# Patient Record
Sex: Female | Born: 1945 | Race: White | Hispanic: No | Smoking: Never smoker
Health system: Southern US, Community
[De-identification: ages and names within clinical notes are randomized; demographics above are authoritative.]

## PROBLEM LIST (undated history)

## (undated) DIAGNOSIS — I499 Cardiac arrhythmia, unspecified: Secondary | ICD-10-CM

## (undated) DIAGNOSIS — C801 Malignant (primary) neoplasm, unspecified: Secondary | ICD-10-CM

## (undated) DIAGNOSIS — F419 Anxiety disorder, unspecified: Secondary | ICD-10-CM

## (undated) DIAGNOSIS — M1990 Unspecified osteoarthritis, unspecified site: Secondary | ICD-10-CM

## (undated) DIAGNOSIS — F4310 Post-traumatic stress disorder, unspecified: Secondary | ICD-10-CM

## (undated) DIAGNOSIS — E785 Hyperlipidemia, unspecified: Secondary | ICD-10-CM

## (undated) DIAGNOSIS — I10 Essential (primary) hypertension: Secondary | ICD-10-CM

## (undated) HISTORY — DX: Essential (primary) hypertension: I10

## (undated) HISTORY — DX: Cardiac arrhythmia, unspecified: I49.9

## (undated) HISTORY — DX: Hyperlipidemia, unspecified: E78.5

## (undated) HISTORY — DX: Post-traumatic stress disorder, unspecified: F43.10

## (undated) HISTORY — PX: APPENDECTOMY: SHX54

## (undated) HISTORY — DX: Unspecified osteoarthritis, unspecified site: M19.90

## (undated) HISTORY — DX: Anxiety disorder, unspecified: F41.9

---

## 1975-04-18 DIAGNOSIS — S3992XA Unspecified injury of lower back, initial encounter: Secondary | ICD-10-CM

## 1975-04-18 HISTORY — DX: Unspecified injury of lower back, initial encounter: S39.92XA

## 2006-09-11 ENCOUNTER — Ambulatory Visit: Payer: Self-pay | Admitting: Gastroenterology

## 2007-12-12 ENCOUNTER — Ambulatory Visit: Payer: Self-pay

## 2009-01-26 ENCOUNTER — Emergency Department: Payer: Self-pay | Admitting: Emergency Medicine

## 2009-02-02 ENCOUNTER — Emergency Department: Payer: Self-pay | Admitting: Emergency Medicine

## 2009-02-09 ENCOUNTER — Emergency Department: Payer: Self-pay | Admitting: Emergency Medicine

## 2009-03-01 ENCOUNTER — Emergency Department: Payer: Self-pay | Admitting: Emergency Medicine

## 2009-03-03 ENCOUNTER — Ambulatory Visit: Payer: Self-pay | Admitting: Physician Assistant

## 2009-03-03 ENCOUNTER — Emergency Department: Payer: Self-pay | Admitting: Emergency Medicine

## 2009-03-08 ENCOUNTER — Emergency Department: Payer: Self-pay | Admitting: Emergency Medicine

## 2009-03-11 ENCOUNTER — Emergency Department: Payer: Self-pay | Admitting: Emergency Medicine

## 2009-03-14 ENCOUNTER — Ambulatory Visit: Payer: Self-pay | Admitting: Internal Medicine

## 2009-03-24 ENCOUNTER — Emergency Department: Payer: Self-pay | Admitting: Unknown Physician Specialty

## 2009-03-30 ENCOUNTER — Emergency Department: Payer: Self-pay | Admitting: Emergency Medicine

## 2009-04-09 ENCOUNTER — Emergency Department: Payer: Self-pay | Admitting: Internal Medicine

## 2009-04-19 ENCOUNTER — Emergency Department: Payer: Self-pay | Admitting: Emergency Medicine

## 2010-01-24 ENCOUNTER — Encounter: Payer: Self-pay | Admitting: Specialist

## 2014-05-19 ENCOUNTER — Ambulatory Visit: Payer: Self-pay | Admitting: Family Medicine

## 2014-06-16 ENCOUNTER — Ambulatory Visit
Admit: 2014-06-16 | Disposition: A | Discharge status: Home / Self Care | Payer: Self-pay | Attending: Family Medicine | Admitting: Family Medicine

## 2014-07-17 ENCOUNTER — Ambulatory Visit
Admit: 2014-07-17 | Disposition: A | Discharge status: Home / Self Care | Payer: Self-pay | Attending: Family Medicine | Admitting: Family Medicine

## 2014-07-21 ENCOUNTER — Encounter
Admit: 2014-07-21 | Disposition: A | Discharge status: Home / Self Care | Payer: Self-pay | Attending: Family Medicine | Admitting: Family Medicine

## 2014-08-20 ENCOUNTER — Ambulatory Visit: Payer: Self-pay | Admitting: Physical Therapy

## 2014-08-21 ENCOUNTER — Ambulatory Visit: Payer: Medicare PPO | Attending: Family Medicine | Admitting: Physical Therapy

## 2014-08-27 ENCOUNTER — Encounter: Payer: Self-pay | Admitting: Physical Therapy

## 2014-09-03 ENCOUNTER — Encounter: Payer: Self-pay | Admitting: Physical Therapy

## 2014-09-10 ENCOUNTER — Encounter: Payer: Self-pay | Admitting: Physical Therapy

## 2015-04-29 DIAGNOSIS — G894 Chronic pain syndrome: Secondary | ICD-10-CM | POA: Diagnosis not present

## 2015-04-29 DIAGNOSIS — Z0001 Encounter for general adult medical examination with abnormal findings: Secondary | ICD-10-CM | POA: Diagnosis not present

## 2015-04-29 DIAGNOSIS — I10 Essential (primary) hypertension: Secondary | ICD-10-CM | POA: Diagnosis not present

## 2015-04-29 DIAGNOSIS — F411 Generalized anxiety disorder: Secondary | ICD-10-CM | POA: Diagnosis not present

## 2015-04-29 DIAGNOSIS — K219 Gastro-esophageal reflux disease without esophagitis: Secondary | ICD-10-CM | POA: Diagnosis not present

## 2015-04-29 DIAGNOSIS — K0889 Other specified disorders of teeth and supporting structures: Secondary | ICD-10-CM | POA: Diagnosis not present

## 2015-05-06 ENCOUNTER — Encounter: Payer: Self-pay | Admitting: *Deleted

## 2015-07-26 DIAGNOSIS — F411 Generalized anxiety disorder: Secondary | ICD-10-CM | POA: Diagnosis not present

## 2015-07-26 DIAGNOSIS — J301 Allergic rhinitis due to pollen: Secondary | ICD-10-CM | POA: Diagnosis not present

## 2015-07-26 DIAGNOSIS — E119 Type 2 diabetes mellitus without complications: Secondary | ICD-10-CM | POA: Diagnosis not present

## 2015-07-26 DIAGNOSIS — G894 Chronic pain syndrome: Secondary | ICD-10-CM | POA: Diagnosis not present

## 2015-07-26 DIAGNOSIS — K219 Gastro-esophageal reflux disease without esophagitis: Secondary | ICD-10-CM | POA: Diagnosis not present

## 2015-07-26 DIAGNOSIS — I10 Essential (primary) hypertension: Secondary | ICD-10-CM | POA: Diagnosis not present

## 2015-11-04 DIAGNOSIS — R51 Headache: Secondary | ICD-10-CM | POA: Diagnosis not present

## 2015-11-04 DIAGNOSIS — I10 Essential (primary) hypertension: Secondary | ICD-10-CM | POA: Diagnosis not present

## 2015-11-04 DIAGNOSIS — E782 Mixed hyperlipidemia: Secondary | ICD-10-CM | POA: Diagnosis not present

## 2015-11-04 DIAGNOSIS — Z79899 Other long term (current) drug therapy: Secondary | ICD-10-CM | POA: Diagnosis not present

## 2015-11-04 DIAGNOSIS — Z6826 Body mass index (BMI) 26.0-26.9, adult: Secondary | ICD-10-CM | POA: Diagnosis not present

## 2015-11-04 DIAGNOSIS — T887XXA Unspecified adverse effect of drug or medicament, initial encounter: Secondary | ICD-10-CM | POA: Diagnosis not present

## 2015-11-04 DIAGNOSIS — Z1389 Encounter for screening for other disorder: Secondary | ICD-10-CM | POA: Diagnosis not present

## 2015-11-04 DIAGNOSIS — M546 Pain in thoracic spine: Secondary | ICD-10-CM | POA: Diagnosis not present

## 2015-11-04 DIAGNOSIS — F411 Generalized anxiety disorder: Secondary | ICD-10-CM | POA: Diagnosis not present

## 2016-01-28 DIAGNOSIS — Z6826 Body mass index (BMI) 26.0-26.9, adult: Secondary | ICD-10-CM | POA: Diagnosis not present

## 2016-01-28 DIAGNOSIS — E119 Type 2 diabetes mellitus without complications: Secondary | ICD-10-CM | POA: Diagnosis not present

## 2016-01-28 DIAGNOSIS — K219 Gastro-esophageal reflux disease without esophagitis: Secondary | ICD-10-CM | POA: Diagnosis not present

## 2016-01-28 DIAGNOSIS — G894 Chronic pain syndrome: Secondary | ICD-10-CM | POA: Diagnosis not present

## 2016-01-28 DIAGNOSIS — I10 Essential (primary) hypertension: Secondary | ICD-10-CM | POA: Diagnosis not present

## 2016-01-28 DIAGNOSIS — E782 Mixed hyperlipidemia: Secondary | ICD-10-CM | POA: Diagnosis not present

## 2016-01-28 DIAGNOSIS — F411 Generalized anxiety disorder: Secondary | ICD-10-CM | POA: Diagnosis not present

## 2016-02-18 DIAGNOSIS — Z1211 Encounter for screening for malignant neoplasm of colon: Secondary | ICD-10-CM | POA: Diagnosis not present

## 2016-02-18 DIAGNOSIS — Z1212 Encounter for screening for malignant neoplasm of rectum: Secondary | ICD-10-CM | POA: Diagnosis not present

## 2016-03-16 DIAGNOSIS — E782 Mixed hyperlipidemia: Secondary | ICD-10-CM | POA: Diagnosis not present

## 2016-03-16 DIAGNOSIS — I10 Essential (primary) hypertension: Secondary | ICD-10-CM | POA: Diagnosis not present

## 2016-03-16 DIAGNOSIS — Z6827 Body mass index (BMI) 27.0-27.9, adult: Secondary | ICD-10-CM | POA: Diagnosis not present

## 2016-03-16 DIAGNOSIS — Z79899 Other long term (current) drug therapy: Secondary | ICD-10-CM | POA: Diagnosis not present

## 2016-03-16 DIAGNOSIS — J206 Acute bronchitis due to rhinovirus: Secondary | ICD-10-CM | POA: Diagnosis not present

## 2016-05-10 DIAGNOSIS — G894 Chronic pain syndrome: Secondary | ICD-10-CM | POA: Diagnosis not present

## 2016-05-10 DIAGNOSIS — F411 Generalized anxiety disorder: Secondary | ICD-10-CM | POA: Diagnosis not present

## 2016-05-10 DIAGNOSIS — Z6827 Body mass index (BMI) 27.0-27.9, adult: Secondary | ICD-10-CM | POA: Diagnosis not present

## 2016-05-10 DIAGNOSIS — R51 Headache: Secondary | ICD-10-CM | POA: Diagnosis not present

## 2016-07-07 DIAGNOSIS — Z6828 Body mass index (BMI) 28.0-28.9, adult: Secondary | ICD-10-CM | POA: Diagnosis not present

## 2016-07-07 DIAGNOSIS — F411 Generalized anxiety disorder: Secondary | ICD-10-CM | POA: Diagnosis not present

## 2016-07-07 DIAGNOSIS — G894 Chronic pain syndrome: Secondary | ICD-10-CM | POA: Diagnosis not present

## 2016-07-07 DIAGNOSIS — R05 Cough: Secondary | ICD-10-CM | POA: Diagnosis not present

## 2016-07-07 DIAGNOSIS — R51 Headache: Secondary | ICD-10-CM | POA: Diagnosis not present

## 2016-08-21 DIAGNOSIS — I10 Essential (primary) hypertension: Secondary | ICD-10-CM | POA: Diagnosis not present

## 2016-08-21 DIAGNOSIS — E782 Mixed hyperlipidemia: Secondary | ICD-10-CM | POA: Diagnosis not present

## 2016-08-21 DIAGNOSIS — G894 Chronic pain syndrome: Secondary | ICD-10-CM | POA: Diagnosis not present

## 2016-08-21 DIAGNOSIS — Z6827 Body mass index (BMI) 27.0-27.9, adult: Secondary | ICD-10-CM | POA: Diagnosis not present

## 2016-08-21 DIAGNOSIS — Z0001 Encounter for general adult medical examination with abnormal findings: Secondary | ICD-10-CM | POA: Diagnosis not present

## 2016-09-26 DIAGNOSIS — I10 Essential (primary) hypertension: Secondary | ICD-10-CM | POA: Insufficient documentation

## 2016-09-26 DIAGNOSIS — Z780 Asymptomatic menopausal state: Secondary | ICD-10-CM | POA: Insufficient documentation

## 2016-09-28 ENCOUNTER — Ambulatory Visit: Payer: PPO | Admitting: Pain Medicine

## 2016-10-04 ENCOUNTER — Ambulatory Visit (INDEPENDENT_AMBULATORY_CARE_PROVIDER_SITE_OTHER): Payer: Medicare Other | Admitting: Family Medicine

## 2016-10-04 ENCOUNTER — Encounter: Payer: Self-pay | Admitting: Family Medicine

## 2016-10-04 VITALS — BP 138/84 | HR 96 | Resp 16 | Ht 64.0 in | Wt 153.0 lb

## 2016-10-04 DIAGNOSIS — E663 Overweight: Secondary | ICD-10-CM

## 2016-10-04 DIAGNOSIS — E785 Hyperlipidemia, unspecified: Secondary | ICD-10-CM | POA: Diagnosis not present

## 2016-10-04 DIAGNOSIS — F411 Generalized anxiety disorder: Secondary | ICD-10-CM | POA: Diagnosis not present

## 2016-10-04 DIAGNOSIS — Z23 Encounter for immunization: Secondary | ICD-10-CM

## 2016-10-04 DIAGNOSIS — F410 Panic disorder [episodic paroxysmal anxiety] without agoraphobia: Secondary | ICD-10-CM | POA: Diagnosis not present

## 2016-10-04 DIAGNOSIS — I10 Essential (primary) hypertension: Secondary | ICD-10-CM

## 2016-10-04 DIAGNOSIS — G8929 Other chronic pain: Secondary | ICD-10-CM | POA: Diagnosis not present

## 2016-10-04 DIAGNOSIS — E119 Type 2 diabetes mellitus without complications: Secondary | ICD-10-CM | POA: Diagnosis not present

## 2016-10-04 DIAGNOSIS — M545 Low back pain: Secondary | ICD-10-CM | POA: Diagnosis not present

## 2016-10-04 DIAGNOSIS — M542 Cervicalgia: Secondary | ICD-10-CM

## 2016-10-04 MED ORDER — ENALAPRIL MALEATE 5 MG PO TABS: 5.0000 mg | ORAL_TABLET | Freq: Every day | ORAL | 3 refills | Status: AC

## 2016-10-04 MED ORDER — VERAPAMIL HCL ER 240 MG PO TBCR: 240.0000 mg | EXTENDED_RELEASE_TABLET | Freq: Every day | ORAL | 3 refills | Status: AC

## 2016-10-04 MED ORDER — ZOSTER VAC RECOMB ADJUVANTED 50 MCG/0.5ML IM SUSR: 0.5000 mL | Freq: Once | INTRAMUSCULAR | 1 refills | Status: AC

## 2016-10-06 DIAGNOSIS — F410 Panic disorder [episodic paroxysmal anxiety] without agoraphobia: Secondary | ICD-10-CM | POA: Insufficient documentation

## 2016-10-06 DIAGNOSIS — F4310 Post-traumatic stress disorder, unspecified: Secondary | ICD-10-CM | POA: Insufficient documentation

## 2016-10-06 DIAGNOSIS — G8929 Other chronic pain: Secondary | ICD-10-CM | POA: Insufficient documentation

## 2016-10-06 DIAGNOSIS — E785 Hyperlipidemia, unspecified: Secondary | ICD-10-CM | POA: Insufficient documentation

## 2016-10-06 DIAGNOSIS — M549 Dorsalgia, unspecified: Secondary | ICD-10-CM

## 2016-10-06 DIAGNOSIS — E119 Type 2 diabetes mellitus without complications: Secondary | ICD-10-CM | POA: Insufficient documentation

## 2016-10-06 DIAGNOSIS — F411 Generalized anxiety disorder: Secondary | ICD-10-CM

## 2016-10-06 DIAGNOSIS — M542 Cervicalgia: Secondary | ICD-10-CM

## 2016-10-06 DIAGNOSIS — E663 Overweight: Secondary | ICD-10-CM | POA: Insufficient documentation

## 2016-10-06 NOTE — Progress Notes (Signed)
Date:  10/04/2016   Name:  Cassandra Woodward   DOB:  [birth year]   MRN:  [id number]  PCP:  Adline Potter, MD    Chief Complaint: Establish Care   History of Present Illness:  This is a 71 y.o. female seen for initial visit. Hx PTSD/anxiety/panic attacks on daily Xanax, followed by neuropysch, tapering Fioricet for chronic HA/neck/back pain. Takes tramadol 1-6 daily, followed by pain clinic. Tried starting Paxil, made HTN/anxiety worse. Hx T2DM, lost 40# on Atkins diet, never started meds, a1c 5.8% last week, MCR ok. HTN well controlled on verapamil/enalapril x yrs. LDL 169, TG 304 last week, never on lipid med. Takes Lomotil 1-2 times yearly, Compazine 1-2 times yearly, albuterol q5m, antacid prn, on biotin in past. Father died 35 pneumonia, mother died 48 TBI, brother died 83 unknown cause. Colonoscopy 6 yrs ago ok, Cologuard last year ok, tet < 10 yrs, pneumo x 2, needs zoster, due for mammo/Pap.per pt, no positive Paps in past.  Review of Systems:  Review of Systems  Constitutional: Negative for appetite change, chills, fever and unexpected weight change.  HENT: Negative for ear pain, sinus pain, sore throat and trouble swallowing.   Eyes: Negative for pain.  Respiratory: Negative for cough and shortness of breath.   Cardiovascular: Negative for chest pain and leg swelling.  Gastrointestinal: Negative for abdominal distention, blood in stool, constipation and diarrhea.  Endocrine: Negative for polydipsia and polyuria.  Genitourinary: Negative for difficulty urinating, dysuria and vaginal bleeding.  Musculoskeletal: Negative for myalgias.  Neurological: Negative for tremors, syncope, weakness, light-headedness and numbness.  Hematological: Negative for adenopathy.  Psychiatric/Behavioral: Negative for confusion and dysphoric mood.    Patient Active Problem List   Diagnosis Date Noted  . Diabetes mellitus type 2, controlled, without complications ([place]) 42/70/6237  . Hyperlipidemia  10/06/2016  . Generalized anxiety disorder with panic attacks 10/06/2016  . Chronic neck pain 10/06/2016  . Chronic back pain 10/06/2016  . Essential hypertension 09/26/2016  . Postmenopausal 09/26/2016    Prior to Admission medications   Medication Sig Start Date End Date Taking? Authorizing Provider  albuterol (PROVENTIL HFA;VENTOLIN HFA) 108 (90 Base) MCG/ACT inhaler Inhale into the lungs.   Yes [provider]  ALPRAZolam Duanne Moron) 1 MG tablet Take by mouth.   Yes [provider]  Butalbital-APAP-Caffeine 50-325-40 MG capsule Take by mouth.   Yes [provider]  diphenoxylate-atropine (LOMOTIL) 2.5-0.025 MG tablet Take by mouth.   Yes [provider]  enalapril (VASOTEC) 5 MG tablet Take 1 tablet (5 mg total) by mouth daily. 10/04/16  Yes Damiya Sandefur, Gwyndolyn Saxon, MD  traMADol (ULTRAM) 50 MG tablet Take by mouth.   Yes [provider]  verapamil (CALAN-SR) 240 MG CR tablet Take 1 tablet (240 mg total) by mouth daily. 10/04/16  Yes Khalea Ventura, Gwyndolyn Saxon, MD    Allergies  Allergen Reactions  . Nsaids Other (See Comments)    Causes blisters in mouth   . Sulfa Antibiotics Other (See Comments)    Past Surgical History:  Procedure Laterality Date  . APPENDECTOMY      Social History  Substance Use Topics  . Smoking status: Never Smoker  . Smokeless tobacco: Never Used  . Alcohol use No    Family History  Problem Relation Age of Onset  . Family history unknown: Yes    Medication list has been reviewed and updated.  Physical Examination: BP 138/84   Pulse 96   Resp 16   Ht 5\' 4"  (1.626 m)   Wt  153 lb (69.4 kg)   SpO2 98%   BMI 26.26 kg/m   Physical Exam  Constitutional: She is oriented to person, place, and time. She appears well-developed and well-nourished.  HENT:  Head: Normocephalic and atraumatic.  Right Ear: External ear normal.  Left Ear: External ear normal.  Nose: Nose normal.  Mouth/Throat: Oropharynx is clear and moist.   TMs clear  Eyes: Conjunctivae and EOM are normal. Pupils are equal, round, and reactive to light.  Neck: Neck supple. No thyromegaly present.  Cardiovascular: Normal rate, regular rhythm, normal heart sounds and intact distal pulses.   Pulmonary/Chest: Effort normal and breath sounds normal.  Abdominal: Soft. She exhibits no distension and no mass. There is no tenderness.  Musculoskeletal: She exhibits no edema.  Lymphadenopathy:    She has no cervical adenopathy.  Neurological: She is alert and oriented to person, place, and time. Coordination normal.  Romberg negative, gait slightly wide-based  Skin: Skin is warm and dry.  Psychiatric: She has a normal mood and affect. Her behavior is normal.  Nursing note and vitals reviewed.   Assessment and Plan:  1. Essential hypertension Adequate control on verapamil/enalapril, refill both  2. Controlled type 2 diabetes mellitus without complication, without long-term current use of insulin (HCC) Well controlled on diet alone, MCR ok, may need optho eval, consider B12/vit D levels next visit  3. Hyperlipidemia, unspecified hyperlipidemia type Poor control, declines statin/asa, discuss again next visit  4. Generalized anxiety disorder with panic attacks On daily Xanax, Fioricet taper, followed by neuropysch  5. Chronic neck pain Mild degenerative changes on plain film, on daily tramadol, followed by pain clinic  6. Chronic low back pain without sciatica, unspecified back pain laterality  7. Overweight (BMI 25.0-29.9) Exercise/weight loss discussed  8. Need for zoster vaccination - Zoster Vac Recomb Adjuvanted [facility]) injection; Inject 0.5 mLs into the muscle once.  Dispense: 0.5 mL; Refill: 1  9. HM Consider mammogram next visit, no indication for further Pap smears  Return in about 6 months (around 04/05/2017).   45 minutes spent with patient over half in counseling  Satira Anis. [facility]  10/06/2016

## 2016-10-23 ENCOUNTER — Ambulatory Visit: Payer: PPO | Admitting: Pain Medicine

## 2016-11-02 DIAGNOSIS — G894 Chronic pain syndrome: Secondary | ICD-10-CM | POA: Diagnosis not present

## 2016-12-01 DIAGNOSIS — G894 Chronic pain syndrome: Secondary | ICD-10-CM | POA: Diagnosis not present

## 2016-12-22 ENCOUNTER — Other Ambulatory Visit: Payer: Self-pay | Admitting: Family Medicine

## 2016-12-22 ENCOUNTER — Telehealth: Payer: Self-pay

## 2016-12-22 MED ORDER — PROCHLORPERAZINE MALEATE 5 MG PO TABS: 5.0000 mg | ORAL_TABLET | Freq: Four times a day (QID) | ORAL | 0 refills | Status: DC | PRN

## 2016-12-22 NOTE — Telephone Encounter (Signed)
Left message wanting meds refilled while has stomach bug NVD.   Proclorperazine (Compazine) Chlorhexidine Gluconate  MOUTHWASH   [facility] [place]....she is having them fax orders for this refill as I do not have exact doses and we have not filled these before.  Also wants the Diphenoxylate atropine .5-0.025

## 2016-12-22 NOTE — Telephone Encounter (Signed)
[facility]. Diphenoxylate is controlled substance, recommend use OTC Imodium. Don't see indication for chlorhexidine mouthwash.

## 2016-12-25 NOTE — Telephone Encounter (Signed)
Left Message

## 2017-02-14 ENCOUNTER — Telehealth: Payer: Self-pay

## 2017-02-14 ENCOUNTER — Other Ambulatory Visit: Payer: Self-pay | Admitting: Family Medicine

## 2017-02-14 MED ORDER — DIPHENOXYLATE-ATROPINE 2.5-0.025 MG PO TABS: 1.0000 | ORAL_TABLET | Freq: Four times a day (QID) | ORAL | 2 refills | Status: DC | PRN

## 2017-02-14 NOTE — Telephone Encounter (Signed)
Patient states she can not live without the atropine. Tried Imodium and can not get better without the Atropine. Sphincter muscle to week begging to know if she can get Atropine back. [phone].......Marland KitchenPlease let me know what to do.

## 2017-02-14 NOTE — Telephone Encounter (Signed)
Is she talking about Lomotil?

## 2017-03-02 DIAGNOSIS — G894 Chronic pain syndrome: Secondary | ICD-10-CM | POA: Diagnosis not present

## 2017-03-02 DIAGNOSIS — M47812 Spondylosis without myelopathy or radiculopathy, cervical region: Secondary | ICD-10-CM | POA: Diagnosis not present

## 2017-03-02 DIAGNOSIS — M4726 Other spondylosis with radiculopathy, lumbar region: Secondary | ICD-10-CM | POA: Diagnosis not present

## 2017-03-06 ENCOUNTER — Other Ambulatory Visit: Payer: Self-pay | Admitting: Family Medicine

## 2017-03-06 DIAGNOSIS — Z1231 Encounter for screening mammogram for malignant neoplasm of breast: Secondary | ICD-10-CM

## 2017-03-07 ENCOUNTER — Telehealth: Payer: Self-pay

## 2017-03-07 NOTE — Telephone Encounter (Signed)
Has appt on 12/20 at [facility] skin center and needs referral faxed to [phone] for consult of spot on face with hx skin cancers.

## 2017-03-07 NOTE — Telephone Encounter (Signed)
Have not seen for this problem so cannot provide referral. Most specialty visits do not require referral for insurance coverage.

## 2017-03-12 NOTE — Telephone Encounter (Signed)
Left message

## 2017-03-15 NOTE — Telephone Encounter (Signed)
Called several times will wait for call back

## 2017-03-27 ENCOUNTER — Telehealth: Payer: Self-pay | Admitting: Family Medicine

## 2017-03-27 NOTE — Telephone Encounter (Signed)
Called pt to sched for AWV with Nurse Health Advisor.  C/b #  [phone] on Skype [email or web address] if you have questions ° °

## 2017-03-29 ENCOUNTER — Inpatient Hospital Stay: Admission: RE | Admit: 2017-03-29 | Payer: PPO | Source: Ambulatory Visit

## 2017-04-02 ENCOUNTER — Encounter: Payer: Self-pay | Admitting: Family Medicine

## 2017-04-02 ENCOUNTER — Telehealth: Payer: Self-pay

## 2017-04-02 ENCOUNTER — Ambulatory Visit (INDEPENDENT_AMBULATORY_CARE_PROVIDER_SITE_OTHER): Payer: Medicare Other | Admitting: Family Medicine

## 2017-04-02 VITALS — BP 112/78 | HR 97 | Resp 16 | Ht 64.0 in | Wt 158.0 lb

## 2017-04-02 DIAGNOSIS — I10 Essential (primary) hypertension: Secondary | ICD-10-CM

## 2017-04-02 DIAGNOSIS — G8929 Other chronic pain: Secondary | ICD-10-CM

## 2017-04-02 DIAGNOSIS — R42 Dizziness and giddiness: Secondary | ICD-10-CM

## 2017-04-02 DIAGNOSIS — M542 Cervicalgia: Secondary | ICD-10-CM | POA: Diagnosis not present

## 2017-04-02 DIAGNOSIS — F411 Generalized anxiety disorder: Secondary | ICD-10-CM | POA: Diagnosis not present

## 2017-04-02 DIAGNOSIS — D229 Melanocytic nevi, unspecified: Secondary | ICD-10-CM | POA: Diagnosis not present

## 2017-04-02 DIAGNOSIS — E663 Overweight: Secondary | ICD-10-CM

## 2017-04-02 DIAGNOSIS — J309 Allergic rhinitis, unspecified: Secondary | ICD-10-CM | POA: Diagnosis not present

## 2017-04-02 DIAGNOSIS — E119 Type 2 diabetes mellitus without complications: Secondary | ICD-10-CM | POA: Diagnosis not present

## 2017-04-02 DIAGNOSIS — Z0000 Encounter for general adult medical examination without abnormal findings: Secondary | ICD-10-CM

## 2017-04-02 DIAGNOSIS — F410 Panic disorder [episodic paroxysmal anxiety] without agoraphobia: Secondary | ICD-10-CM

## 2017-04-02 DIAGNOSIS — D7589 Other specified diseases of blood and blood-forming organs: Secondary | ICD-10-CM | POA: Diagnosis not present

## 2017-04-02 DIAGNOSIS — D72829 Elevated white blood cell count, unspecified: Secondary | ICD-10-CM

## 2017-04-02 NOTE — Progress Notes (Signed)
Date:  04/02/2017   Name:  Cassandra Woodward   DOB:  1945/08/13   MRN:  [id number]  PCP:  Adline Potter, MD    Chief Complaint: Diabetes   History of Present Illness:  This is a 71 y.o. female seen in 6 month f/u from initial visit. Recent dental surgery, on Vicodin prn. Requests ENT referral for recurrent dizziness with head movement and derm referral for multiple atypical nevi. T2DM with a1c 5.8% in June, on NCS diet, declines statin. HAs/GAD stable on Fioricet/Xanax prn. Chronic neck pain now off tramadol while on Vicodin per pain clinic. AR sxs worse, taking Claritin but not steroid NS. Rescheduling mammo. Leukocytosis last CBC.  Review of Systems:  Review of Systems  Constitutional: Negative for chills and fever.  Respiratory: Negative for cough and shortness of breath.   Cardiovascular: Negative for chest pain and leg swelling.  Endocrine: Negative for polydipsia and polyuria.  Genitourinary: Negative for difficulty urinating.  Neurological: Negative for syncope and light-headedness.    Patient Active Problem List   Diagnosis Date Noted  . Dizziness 04/02/2017  . Allergic rhinitis 04/02/2017  . Leukocytosis 04/02/2017  . Atypical nevi 04/02/2017  . Diabetes mellitus type 2, controlled, without complications ([place]) 21/30/8657  . Hyperlipidemia 10/06/2016  . Generalized anxiety disorder with panic attacks 10/06/2016  . Chronic neck pain 10/06/2016  . Chronic back pain 10/06/2016  . Overweight (BMI 25.0-29.9) 10/06/2016  . PTSD (post-traumatic stress disorder) 10/06/2016  . Essential hypertension 09/26/2016  . Postmenopausal 09/26/2016    Prior to Admission medications   Medication Sig Start Date End Date Taking? Authorizing Provider  albuterol (PROVENTIL HFA;VENTOLIN HFA) 108 (90 Base) MCG/ACT inhaler Inhale into the lungs.   Yes [provider]  ALPRAZolam Duanne Moron) 1 MG tablet Take 0.5 mg by mouth 3 (three) times daily.   Yes [provider]   Butalbital-APAP-Caffeine 50-325-40 MG capsule Take by mouth.   Yes [provider]  enalapril (VASOTEC) 5 MG tablet Take 1 tablet (5 mg total) by mouth daily. 10/04/16  Yes Harriet Bollen, Gwyndolyn Saxon, MD  traMADol (ULTRAM) 50 MG tablet Take by mouth.   Yes [provider]  verapamil (CALAN-SR) 240 MG CR tablet Take 1 tablet (240 mg total) by mouth daily. 10/04/16  Yes Consuela Widener, Gwyndolyn Saxon, MD  diphenoxylate-atropine (LOMOTIL) 2.5-0.025 MG tablet Take 1 tablet by mouth 4 (four) times daily as needed for diarrhea or loose stools. Patient not taking: Reported on 04/02/2017 02/14/17   Adline Potter, MD  prochlorperazine (COMPAZINE) 5 MG tablet Take 1 tablet (5 mg total) by mouth every 6 (six) hours as needed for nausea or vomiting. Patient not taking: Reported on 04/02/2017 12/22/16   Adline Potter, MD    Allergies  Allergen Reactions  . Nsaids Other (See Comments)    Causes blisters in mouth   . Sulfa Antibiotics Other (See Comments)    Past Surgical History:  Procedure Laterality Date  . APPENDECTOMY      Social History   Tobacco Use  . Smoking status: Never Smoker  . Smokeless tobacco: Never Used  Substance Use Topics  . Alcohol use: No  . Drug use: No    Family History  Family history unknown: Yes    Medication list has been reviewed and updated.  Physical Examination: BP 112/78   Pulse 97   Resp 16   Ht 5\' 4"  (1.626 m)   Wt 158 lb (71.7 kg)   SpO2 98%   BMI 27.12 kg/m   Physical Exam  Constitutional:  She appears well-developed and well-nourished.  Cardiovascular: Normal rate, regular rhythm and normal heart sounds.  Pulmonary/Chest: Effort normal and breath sounds normal.  Musculoskeletal: She exhibits no edema.  Neurological: She is alert.  Skin: Skin is warm and dry.  Psychiatric: She has a normal mood and affect. Her behavior is normal.  Nursing note and vitals reviewed.   Assessment and Plan:  1. Controlled type 2 diabetes mellitus without  complication, without long-term current use of insulin (HCC) On NCS diet only - HgB A1c  2. Essential hypertension Well controlled on verapamil/enalapril  3. Generalized anxiety disorder with panic attacks Adequate control on Xanax prn per neuropsych - B12  4. Chronic neck pain Usually on tramadol per pain clinic  5. Overweight (BMI 25.0-29.9) Weight up 5#, exercise/weight loss discussed  6. Allergic rhinitis, unspecified seasonality, unspecified trigger Cont Claritin, add Flonase  7. Dizziness Likely BPV - Ambulatory referral to ENT  8. Leukocytosis, unspecified type - CBC  9. Atypical nevi - Ambulatory referral to Dermatology  10. Healthcare maintenance Reschedule mammo - Hepatitis C Antibody  Return in about 6 months (around 10/01/2017).  Satira Anis. [facility]  04/03/2017

## 2017-04-02 NOTE — Telephone Encounter (Signed)
Wants referral to skin doctor Mahalia Longest in [place] for precancerous check up on spots already removed in past but having some issues with more spots. [phone] is number if you can enter it in referral for [facility].

## 2017-04-03 LAB — CBC
HEMATOCRIT: 46.7 % — AB (ref 34.0–46.6)
HEMOGLOBIN: 15.8 g/dL (ref 11.1–15.9)
MCH: 33.5 pg — AB (ref 26.6–33.0)
MCHC: 33.8 g/dL (ref 31.5–35.7)
MCV: 99 fL — ABNORMAL HIGH (ref 79–97)
Platelets: 248 10*3/uL (ref 150–379)
RBC: 4.72 x10E6/uL (ref 3.77–5.28)
RDW: 13.4 % (ref 12.3–15.4)
WBC: 8 10*3/uL (ref 3.4–10.8)

## 2017-04-03 LAB — HEMOGLOBIN A1C
Est. average glucose Bld gHb Est-mCnc: 126 mg/dL
Hgb A1c MFr Bld: 6 % — ABNORMAL HIGH (ref 4.8–5.6)

## 2017-04-03 LAB — HEPATITIS C ANTIBODY: Hep C Virus Ab: <0.1 s/co ratio (ref 0.0–0.9)

## 2017-04-03 LAB — VITAMIN B12: Vitamin B-12: 645 pg/mL (ref 232–1245)

## 2017-04-05 ENCOUNTER — Ambulatory Visit: Payer: Medicare Other | Admitting: Family Medicine

## 2017-04-19 ENCOUNTER — Inpatient Hospital Stay: Admission: RE | Admit: 2017-04-19 | Payer: PPO | Source: Ambulatory Visit

## 2017-04-30 ENCOUNTER — Inpatient Hospital Stay: Admission: RE | Admit: 2017-04-30 | Payer: PPO | Source: Ambulatory Visit

## 2017-05-25 DIAGNOSIS — Z79899 Other long term (current) drug therapy: Secondary | ICD-10-CM | POA: Diagnosis not present

## 2017-05-25 DIAGNOSIS — M47812 Spondylosis without myelopathy or radiculopathy, cervical region: Secondary | ICD-10-CM | POA: Diagnosis not present

## 2017-05-25 DIAGNOSIS — Z79891 Long term (current) use of opiate analgesic: Secondary | ICD-10-CM | POA: Diagnosis not present

## 2017-05-25 DIAGNOSIS — M4726 Other spondylosis with radiculopathy, lumbar region: Secondary | ICD-10-CM | POA: Diagnosis not present

## 2017-05-25 DIAGNOSIS — G894 Chronic pain syndrome: Secondary | ICD-10-CM | POA: Diagnosis not present

## 2017-06-08 ENCOUNTER — Other Ambulatory Visit: Payer: Self-pay

## 2017-06-08 DIAGNOSIS — E1122 Type 2 diabetes mellitus with diabetic chronic kidney disease: Secondary | ICD-10-CM

## 2017-06-08 DIAGNOSIS — N182 Chronic kidney disease, stage 2 (mild): Principal | ICD-10-CM

## 2017-06-08 MED ORDER — LANCETS MISC: 3 refills | Status: DC

## 2017-06-08 MED ORDER — GLUCOSE BLOOD VI STRP: ORAL_STRIP | 12 refills | Status: DC

## 2017-06-12 DIAGNOSIS — L570 Actinic keratosis: Secondary | ICD-10-CM | POA: Diagnosis not present

## 2017-06-12 DIAGNOSIS — D229 Melanocytic nevi, unspecified: Secondary | ICD-10-CM | POA: Diagnosis not present

## 2017-06-12 DIAGNOSIS — L821 Other seborrheic keratosis: Secondary | ICD-10-CM | POA: Diagnosis not present

## 2017-06-12 DIAGNOSIS — L820 Inflamed seborrheic keratosis: Secondary | ICD-10-CM | POA: Diagnosis not present

## 2017-06-12 DIAGNOSIS — L578 Other skin changes due to chronic exposure to nonionizing radiation: Secondary | ICD-10-CM | POA: Diagnosis not present

## 2017-06-20 ENCOUNTER — Telehealth: Payer: Self-pay | Admitting: Family Medicine

## 2017-06-20 NOTE — Telephone Encounter (Signed)
Called to schedule AWV with Nurse Health Advisor. °Cassandra Woodward °[phone]  °Skype [email or web address]  ° °

## 2017-06-26 ENCOUNTER — Encounter: Payer: Self-pay | Admitting: Family Medicine

## 2017-06-26 DIAGNOSIS — D7589 Other specified diseases of blood and blood-forming organs: Secondary | ICD-10-CM | POA: Insufficient documentation

## 2017-07-09 ENCOUNTER — Ambulatory Visit: Payer: Medicare Other

## 2017-07-12 ENCOUNTER — Telehealth: Payer: Self-pay

## 2017-07-12 ENCOUNTER — Other Ambulatory Visit: Payer: Self-pay | Admitting: Family Medicine

## 2017-07-12 ENCOUNTER — Other Ambulatory Visit: Payer: Self-pay

## 2017-07-12 MED ORDER — PROCHLORPERAZINE MALEATE 5 MG PO TABS: 5.0000 mg | ORAL_TABLET | Freq: Four times a day (QID) | ORAL | 0 refills | Status: AC | PRN

## 2017-07-12 MED ORDER — BLOOD GLUCOSE MONITOR KIT: PACK | 0 refills | Status: DC

## 2017-07-12 NOTE — Telephone Encounter (Signed)
Rx sent 

## 2017-07-12 NOTE — Telephone Encounter (Signed)
Wants refill on Prochlorperazine 5 mg tablets 1 every 6 hours PRN Nausea.

## 2017-07-18 ENCOUNTER — Ambulatory Visit (INDEPENDENT_AMBULATORY_CARE_PROVIDER_SITE_OTHER): Payer: Medicare Other

## 2017-07-18 VITALS — BP 108/62 | HR 80 | Temp 97.8°F | Resp 14 | Ht 64.0 in | Wt 153.6 lb

## 2017-07-18 DIAGNOSIS — E2839 Other primary ovarian failure: Secondary | ICD-10-CM | POA: Diagnosis not present

## 2017-07-18 DIAGNOSIS — Z1239 Encounter for other screening for malignant neoplasm of breast: Secondary | ICD-10-CM

## 2017-07-18 DIAGNOSIS — G8929 Other chronic pain: Secondary | ICD-10-CM

## 2017-07-18 DIAGNOSIS — Z0000 Encounter for general adult medical examination without abnormal findings: Secondary | ICD-10-CM | POA: Diagnosis not present

## 2017-07-18 DIAGNOSIS — M545 Low back pain: Secondary | ICD-10-CM

## 2017-07-18 DIAGNOSIS — Z9181 History of falling: Secondary | ICD-10-CM | POA: Diagnosis not present

## 2017-07-18 DIAGNOSIS — Z1231 Encounter for screening mammogram for malignant neoplasm of breast: Secondary | ICD-10-CM | POA: Diagnosis not present

## 2017-07-18 NOTE — Progress Notes (Signed)
Subjective:   Cassandra Woodward is a 72 y.o. female who presents for an Initial Medicare Annual Wellness Visit.  Review of Systems    N/A  Cardiac Risk Factors include: advanced age (>1mn, >>79women);diabetes mellitus;dyslipidemia;hypertension     Objective:    Today's Vitals   07/18/17 1359  BP: 108/62  Pulse: 80  Resp: 14  Temp: 97.8 F (36.6 C)  TempSrc: Oral  SpO2: 97%  Weight: 153 lb 9.6 oz (69.7 kg)  Height: '5\' 4"'$  (1.626 m)   Body mass index is 26.37 kg/m.  Advanced Directives 07/18/2017 10/04/2016  Does Patient Have a Medical Advance Directive? No Yes  [place]  Would patient like information on creating a medical advance directive? No - Patient declined -    Current Medications (verified) Outpatient Encounter Medications as of 07/18/2017  Medication Sig  . albuterol (PROVENTIL HFA;VENTOLIN HFA) 108 (90 Base) MCG/ACT inhaler Inhale 2 puffs into the lungs every 6 (six) hours as needed.  . ALPRAZolam (XANAX) 1 MG tablet Take 0.5 mg by mouth 3 (three) times daily as needed.  . blood glucose meter kit and supplies KIT Dispense Accucheck Nano Meter per Insurance/Patient request. Use up to four times daily as directed. (FOR ICD 10: E11.4 ).  . Butalbital-APAP-Caffeine 50-325-40 MG capsule Take 1 capsule by mouth every 6 (six) hours as needed.  . diphenoxylate-atropine (LOMOTIL) 2.5-0.025 MG tablet Take 0.025 tablets by mouth daily as needed.  . enalapril (VASOTEC) 5 MG tablet Take 1 tablet (5 mg total) by mouth daily.  .Marland Kitchenglucose blood test strip Use to check Blood Sugar up to 3 times daily with Accucheck devise.for Diabetes Mellitus 2 .ICD 10 E11.9  . Lancets MISC Use with accucheck devise and strips to check Blood Sugar up to  3 times daily for ICD 10 EX38.1DM 2 uncomplicated  . prochlorperazine (COMPAZINE) 5 MG tablet Take 1 tablet (5 mg total) by mouth every 6 (six) hours as needed for nausea or vomiting.  .Marland Kitchen PROCTOSOL HC 2.5 % rectal cream Place 1 Tube rectally daily as needed.  . traMADol (ULTRAM) 50 MG tablet Take 1 tablet by mouth every 6 (six) hours as needed.  . verapamil (CALAN-SR) 240 MG CR tablet Take 1 tablet (240 mg total) by mouth daily.  . fluticasone (FLONASE) 50 MCG/ACT nasal spray Place 2 sprays into both nostrils daily.  .Marland Kitchenloratadine (CLARITIN) 10 MG tablet Take 10 mg by mouth daily.   No facility-administered encounter medications on file as of 07/18/2017.     Allergies (verified) Aspirin; Bromsulphalein; Pentazocine; Nsaids; Para-benzoquinone; Sulfa antibiotics; and Tolmetin   History: Past Medical History:  Diagnosis Date  . Anxiety   . Arrhythmia   . Arthritis   . Hyperlipidemia    Tryglicerides   . Hypertension   . PTSD (post-traumatic stress disorder)    Past Surgical History:  Procedure Laterality Date  . APPENDECTOMY     Family History  Adopted: Yes   Social History   Socioeconomic History  . Marital status: Widowed    Spouse name: Not on file  . Number of children: 2  . Years of education: Not on file  . Highest education level: Master's degree (e.g., MA, MS, MEng, MEd, MSW, MBA)  Occupational History  . Occupation: Retired  SScientific laboratory technician . Financial resource strain: Not hard at all  . Food insecurity:    Worry: Never true    Inability: Never true  .  Transportation needs:    Medical: No    Non-medical: No  Tobacco Use  . Smoking status: Never Smoker  . Smokeless tobacco: Never Used  . Tobacco comment: smoking cessation materials not required  Substance and Sexual Activity  . Alcohol use: No  . Drug use: No  . Sexual activity: Not Currently  Lifestyle  . Physical activity:    Days per week: 0 days    Minutes per session: 0 min  . Stress: Not at all  Relationships  . Social connections:    Talks on phone: Patient refused    Gets together: Patient refused    Attends religious service: Patient refused    Active member of club or  organization: Patient refused    Attends meetings of clubs or organizations: Patient refused    Relationship status: Widowed  Other Topics Concern  . Not on file  Social History Narrative  . Not on file    Tobacco Counseling Counseling given: No Comment: smoking cessation materials not required   Clinical Intake:  Pre-visit preparation completed: Yes  Pain : No/denies pain   BMI - recorded: 27.12 Nutritional Status: BMI 25 -29 Overweight Nutrition Risk Assessment: Has the patient had any N/V/D within the last 2 months?  No Does the patient have any non-healing wounds?  No Has the patient had any unintentional weight loss or weight gain?  No  Is the patient diabetic?  Yes If diabetic, was a CBG obtained today?  No Did the patient bring in their glucometer from home?  No Comments: Pt monitors CBG's daily. Denies having any financial strains with device or supplies.  Diabetic Exams: Diabetic Eye Exam: Overdue for diabetic eye exam. Pt states she is not established with a physician to complete this exam. Pt has been advised about the importance in completing this exam. Pt declined my offer to provide her with a list of providers to establish care. States she has a provider in mind where she would like to establish care. Strongly recommended she call this provider she has in mind to establish care and to complete her diabetic eye exam. Once completed, reminded pt to provide the office with a copy of her diabetic eye exam. Verbalized acceptance and understanding.  Diabetic Foot Exam: Overdue for diabetic foot exam. Pt has been advised about the importance in completing this exam. Advised to schedule an appt with Dr. Army Melia to complete this exam. Verbalized acceptance and understanding.  How often do you need to have someone help you when you read instructions, pamphlets, or other written materials from your doctor or pharmacy?: 1 - Never  Interpreter Needed?: No  Information  entered by :: AEversole, LPN   Activities of Daily Living In your present state of health, do you have any difficulty performing the following activities: 07/18/2017 10/04/2016  Hearing? N N  Comment denies hearing aids -  Vision? N N  Comment wears eyeglasses; sees "floaters"; not established with a provider for annual eye exams -  Difficulty concentrating or making decisions? N N  Walking or climbing stairs? Y N  Comment back pain -  Dressing or bathing? N N  Doing errands, shopping? N N  Preparing Food and eating ? N -  Comment denies dentures -  Using the Toilet? N -  In the past six months, have you accidently leaked urine? Y -  Comment stress incontinence -  Do you have problems with loss of bowel control? N -  Managing your Medications? N -  Managing your Finances? N -  Housekeeping or managing your Housekeeping? N -  Some recent data might be hidden     Immunizations and Health Maintenance Immunization History  Administered Date(s) Administered  . Influenza, High Dose Seasonal PF 01/31/2017  . Influenza-Unspecified 01/31/2017  . Pneumococcal Conjugate-13 04/17/2014  . Pneumococcal Polysaccharide-23 04/18/2015  . Tdap 04/17/2008   Health Maintenance Due  Topic Date Due  . FOOT EXAM  03[birth year]12/1956  . OPHTHALMOLOGY EXAM  03/12/1957  . MAMMOGRAM  03/12/1997  . DEXA SCAN  03/12/2012    Patient Care Team: Adline Potter, MD as PCP - General (Family Medicine) Abbie Sons, MD as Consulting Physician (Psychiatry) Angola, Jimmy J, MD as Consulting Physician (Physical Medicine and Rehabilitation) Lillia Carmel, Jeralene Huff, MD as Consulting Physician (Dermatology)  Indicate any recent Medical Services you may have received from other than [facility] providers in the past year (date may be approximate).     Assessment:   This is a routine wellness examination for Alexianna.  Hearing/Vision screen Vision Screening Comments: Not established with a provider for annual eye exams.  Pt states she has a provider in mind where she would like to establish care. Declined my offer to provide her with a list of providers  Dietary issues and exercise activities discussed: Current Exercise Habits: The patient does not participate in regular exercise at present, Exercise limited by: None identified  Goals    . DIET - INCREASE WATER INTAKE     Recommend to drink at least 6-8 8oz glasses of water per day.      Depression Screen PHQ 2/9 Scores 07/18/2017 07/18/2017 10/04/2016 10/04/2016 10/04/2016  PHQ - 2 Score 0 0 0 - -  PHQ- 9 Score 0 - - - -  Exception Documentation - - - Other- indicate reason in comment box Other- indicate reason in comment box  Not completed - - - psych  follows psych     Fall Risk Fall Risk  07/18/2017 10/04/2016 10/04/2016 10/04/2016  Falls in the past year? No No No No  Risk for fall due to : Impaired vision;Medication side effect - - -  Risk for fall due to: Comment wears glasses; states she sees "floaters"; not established with a provider for annual eye exams - - -    Is the home free of loose throw rugs in walkways, pet beds, electrical cords, etc? Yes Adequate lighting to reduce risk of falls?  Yes In addition, does the patient have any of the following: Stairs in or around the home WITH handrails? Yes Grab bars in the bathroom? Yes  Shower chair or a place to sit while bathing? No Use of a cane, walker or w/c? No Use of an elevated toilet seat or a handicapped toilet? No  Timed Get Up and Go Performed: Yes. Pt ambulated 10 feet within 10 sec. Gait stead-fast and without the use of an assistive device. No intervention required at this time. Fall risk prevention has been discussed.  Pt states she does not take a shower due to back pain. Does not feel her shower is safe enough to shower. Community Resource Referral sent to Care Guide for a walk in tub with seat.  Cognitive Function:     6CIT Screen 07/18/2017  What Year? 0 points  What month? 0  points  What time? 0 points  Count back from 20 0 points  Months in reverse 0 points  Repeat phrase 4 points  Total Score 4    Screening Tests Health  Maintenance  Topic Date Due  . FOOT EXAM  03/12/1956  . OPHTHALMOLOGY EXAM  03/12/1957  . MAMMOGRAM  03/12/1997  . DEXA SCAN  03/12/2012  . HEMOGLOBIN A1C  10/01/2017  . INFLUENZA VACCINE  11/15/2017  . TETANUS/TDAP  04/17/2018  . Fecal DNA (Cologuard)  07/19/2018  . Hepatitis C Screening  Completed  . PNA vac Low Risk Adult  Completed    Qualifies for Shingles Vaccine? Yes. Due for Zostavax or Shingrix vaccine. Education has been provided regarding the importance of this vaccine. Pt has been advised to call her insurance company to determine her out of pocket expense. Advised she may also receive this vaccine at her local pharmacy or Health Dept. Verbalized acceptance and understanding.  Cancer Screenings: Lung: Low Dose CT Chest recommended if Age 21-80 years, 30 pack-year currently smoking OR have quit w/in 15years. Patient does not qualify. Breast: Up to date on Mammogram? No. Ordered today. Pt aware she will receive a call from our office re: her appt.   Up to date of Bone Density/Dexa? No. Ordered today. Pt aware she will receive a call from our office re: her appt.   Colorectal: Completed Cologuard 07/19/15. Repeat every 3 years.  Additional Screenings: Hepatitis C Screening: Completed 04/02/17   Plan:  I have personally reviewed and addressed the Medicare Annual Wellness questionnaire and have noted the following in the patient's chart:  A. Medical and social history B. Use of alcohol, tobacco or illicit drugs  C. Current medications and supplements D. Functional ability and status E.  Nutritional status F.  Physical activity G. Advance directives H. List of other physicians I.  Hospitalizations, surgeries, and ER visits in previous 12 months J.  [place] such as hearing and vision if needed, cognitive and  depression L. Referrals and appointments  In addition, I have reviewed and discussed with patient certain preventive protocols, quality metrics, and best practice recommendations. A written personalized care plan for preventive services as well as general preventive health recommendations were provided to patient.  Signed,  Aleatha Borer, LPN Nurse Health Advisor  MD Recommendations: Due for Zostavax or Shingrix vaccine. Education has been provided regarding the importance of this vaccine. Pt has been advised to call her insurance company to determine her out of pocket expense. Advised she may also receive this vaccine at her local pharmacy or Health Dept. Verbalized acceptance and understanding.  Pt states she does not take a shower due to back pain. Does not feel her shower is safe enough to shower. Community Resource Referral sent to Care Guide for a walk in tub with seat.  Due for Mammogram. Ordered today. Pt aware she will receive a call from our office re: her appt. Message sent to referral coordinator for scheduling purposes.   Due for Bone Density/Dexa. Ordered today. Pt aware she will receive a call from our office re: her appt. Message sent to referral coordinator for scheduling purposes.  Diabetic Eye Exam: Overdue for diabetic eye exam. Pt states she is not established with a physician to complete this exam. Pt has been advised about the importance in completing this exam. Pt declined my offer to provide her with a list of providers to establish care. States she has a provider in mind where she would like to establish care. Strongly recommended she call this provider she has in mind to establish care and to complete her diabetic eye exam. Once completed, reminded pt to provide the office with a copy of her  diabetic eye exam. Verbalized acceptance and understanding.  Diabetic Foot Exam: Overdue for diabetic foot exam. Pt has been advised about the importance in completing this exam.  Advised to schedule an appt with Dr. Army Melia to complete this exam. Verbalized acceptance and understanding.

## 2017-07-18 NOTE — Patient Instructions (Signed)
Cassandra Woodward , Thank you for taking time to come for your Medicare Wellness Visit. I appreciate your ongoing commitment to your health goals. Please review the following plan we discussed and let me know if I can assist you in the future.   Screening recommendations/referrals: Colorectal Screening: Cologuard completed 07/19/15. Repeat every 3 years Mammogram: Ordered today. You will receive a call from our office regarding your appointment Bone Density: Ordered today. You will receive a call from our office regarding your appointment  Vision and Dental Exams: Recommended annual ophthalmology exams for early detection of glaucoma and other disorders of the eye Recommended annual dental exams for proper oral hygiene  Diabetic Exams: Recommended annual diabetic eye exams for early detection of retinopathy Recommended annual diabetic foot exams for early detection of peripheral neuropathy.  Diabetic Eye Exam: Please schedule an appointment with your ophthalmologist for completion Diabetic Foot Exam: Please schedule an appointment with your podiatrist for completion  Vaccinations: Influenza vaccine: Up to date Pneumococcal vaccine: Completed series Tdap vaccine: Up to date Shingles vaccine: Please call your insurance company to determine your out of pocket expense for the Shingrix vaccine. You may also receive this vaccine at your local pharmacy or Health Dept.  Advanced directives: Advance directive discussed with you today.You have declined to receive the documents today.  Conditions/risks identified: Recommend to drink at least 6-8 8oz glasses of water per day.  Next appointment: Please schedule your Annual Wellness Visit with your Nurse Health Advisor in one year.  Preventive Care 71 Years and Older, Female Preventive care refers to lifestyle choices and visits with your health care provider that can promote health and wellness. What does preventive care include?  A yearly physical exam.  This is also called an annual well check.  Dental exams once or twice a year.  Routine eye exams. Ask your health care provider how often you should have your eyes checked.  Personal lifestyle choices, including:  Daily care of your teeth and gums.  Regular physical activity.  Eating a healthy diet.  Avoiding tobacco and drug use.  Limiting alcohol use.  Practicing safe sex.  Taking low-dose aspirin every day.  Taking vitamin and mineral supplements as recommended by your health care provider. What happens during an annual well check? The services and screenings done by your health care provider during your annual well check will depend on your age, overall health, lifestyle risk factors, and family history of disease. Counseling  Your health care provider may ask you questions about your:  Alcohol use.  Tobacco use.  Drug use.  Emotional well-being.  Home and relationship well-being.  Sexual activity.  Eating habits.  History of falls.  Memory and ability to understand (cognition).  Work and work Statistician.  Reproductive health. Screening  You may have the following tests or measurements:  Height, weight, and BMI.  Blood pressure.  Lipid and cholesterol levels. These may be checked every 5 years, or more frequently if you are over 64 years old.  Skin check.  Lung cancer screening. You may have this screening every year starting at age 28 if you have a 30-pack-year history of smoking and currently smoke or have quit within the past 15 years.  Fecal occult blood test (FOBT) of the stool. You may have this test every year starting at age 46.  Flexible sigmoidoscopy or colonoscopy. You may have a sigmoidoscopy every 5 years or a colonoscopy every 10 years starting at age 81.  Hepatitis C blood test.  Hepatitis B blood test.  Sexually transmitted disease (STD) testing.  Diabetes screening. This is done by checking your blood sugar (glucose) after  you have not eaten for a while (fasting). You may have this done every 1-3 years.  Bone density scan. This is done to screen for osteoporosis. You may have this done starting at age 30.  Mammogram. This may be done every 1-2 years. Talk to your health care provider about how often you should have regular mammograms. Talk with your health care provider about your test results, treatment options, and if necessary, the need for more tests. Vaccines  Your health care provider may recommend certain vaccines, such as:  Influenza vaccine. This is recommended every year.  Tetanus, diphtheria, and acellular pertussis (Tdap, Td) vaccine. You may need a Td booster every 10 years.  Zoster vaccine. You may need this after age 95.  Pneumococcal 13-valent conjugate (PCV13) vaccine. One dose is recommended after age 48.  Pneumococcal polysaccharide (PPSV23) vaccine. One dose is recommended after age 3. Talk to your health care provider about which screenings and vaccines you need and how often you need them. This information is not intended to replace advice given to you by your health care provider. Make sure you discuss any questions you have with your health care provider. Document Released: 04/30/2015 Document Revised: 12/22/2015 Document Reviewed: 02/02/2015 Elsevier Interactive Patient Education  2017 [employer] Prevention in the Home Falls can cause injuries. They can happen to people of all ages. There are many things you can do to make your home safe and to help prevent falls. What can I do on the outside of my home?  Regularly fix the edges of walkways and driveways and fix any cracks.  Remove anything that might make you trip as you walk through a door, such as a raised step or threshold.  Trim any bushes or trees on the path to your home.  Use bright outdoor lighting.  Clear any walking paths of anything that might make someone trip, such as rocks or tools.  Regularly  check to see if handrails are loose or broken. Make sure that both sides of any steps have handrails.  Any raised decks and porches should have guardrails on the edges.  Have any leaves, snow, or ice cleared regularly.  Use sand or salt on walking paths during winter.  Clean up any spills in your garage right away. This includes oil or grease spills. What can I do in the bathroom?  Use night lights.  Install grab bars by the toilet and in the tub and shower. Do not use towel bars as grab bars.  Use non-skid mats or decals in the tub or shower.  If you need to sit down in the shower, use a plastic, non-slip stool.  Keep the floor dry. Clean up any water that spills on the floor as soon as it happens.  Remove soap buildup in the tub or shower regularly.  Attach bath mats securely with double-sided non-slip rug tape.  Do not have throw rugs and other things on the floor that can make you trip. What can I do in the bedroom?  Use night lights.  Make sure that you have a light by your bed that is easy to reach.  Do not use any sheets or blankets that are too big for your bed. They should not hang down onto the floor.  Have a firm chair that has side arms. You can use this  for support while you get dressed.  Do not have throw rugs and other things on the floor that can make you trip. What can I do in the kitchen?  Clean up any spills right away.  Avoid walking on wet floors.  Keep items that you use a lot in easy-to-reach places.  If you need to reach something above you, use a strong step stool that has a grab bar.  Keep electrical cords out of the way.  Do not use floor polish or wax that makes floors slippery. If you must use wax, use non-skid floor wax.  Do not have throw rugs and other things on the floor that can make you trip. What can I do with my stairs?  Do not leave any items on the stairs.  Make sure that there are handrails on both sides of the stairs and  use them. Fix handrails that are broken or loose. Make sure that handrails are as long as the stairways.  Check any carpeting to make sure that it is firmly attached to the stairs. Fix any carpet that is loose or worn.  Avoid having throw rugs at the top or bottom of the stairs. If you do have throw rugs, attach them to the floor with carpet tape.  Make sure that you have a light switch at the top of the stairs and the bottom of the stairs. If you do not have them, ask someone to add them for you. What else can I do to help prevent falls?  Wear shoes that:  Do not have high heels.  Have rubber bottoms.  Are comfortable and fit you well.  Are closed at the toe. Do not wear sandals.  If you use a stepladder:  Make sure that it is fully opened. Do not climb a closed stepladder.  Make sure that both sides of the stepladder are locked into place.  Ask someone to hold it for you, if possible.  Clearly mark and make sure that you can see:  Any grab bars or handrails.  First and last steps.  Where the edge of each step is.  Use tools that help you move around (mobility aids) if they are needed. These include:  Canes.  Walkers.  Scooters.  Crutches.  Turn on the lights when you go into a dark area. Replace any light bulbs as soon as they burn out.  Set up your furniture so you have a clear path. Avoid moving your furniture around.  If any of your floors are uneven, fix them.  If there are any pets around you, be aware of where they are.  Review your medicines with your doctor. Some medicines can make you feel dizzy. This can increase your chance of falling. Ask your doctor what other things that you can do to help prevent falls. This information is not intended to replace advice given to you by your health care provider. Make sure you discuss any questions you have with your health care provider. Document Released: 01/28/2009 Document Revised: 09/09/2015 Document  Reviewed: 05/08/2014 Elsevier Interactive Patient Education  2017 [employer].

## 2017-07-26 ENCOUNTER — Ambulatory Visit
Admission: RE | Admit: 2017-07-26 | Discharge: 2017-07-26 | Disposition: A | Discharge status: Home / Self Care | Payer: Medicare Other | Source: Ambulatory Visit | Attending: Family Medicine | Admitting: Family Medicine

## 2017-07-26 ENCOUNTER — Other Ambulatory Visit: Payer: Self-pay | Admitting: Family Medicine

## 2017-07-26 DIAGNOSIS — E2839 Other primary ovarian failure: Secondary | ICD-10-CM | POA: Diagnosis present

## 2017-07-26 DIAGNOSIS — M810 Age-related osteoporosis without current pathological fracture: Secondary | ICD-10-CM | POA: Diagnosis not present

## 2017-07-26 DIAGNOSIS — M8588 Other specified disorders of bone density and structure, other site: Secondary | ICD-10-CM | POA: Diagnosis not present

## 2017-07-26 DIAGNOSIS — Z1231 Encounter for screening mammogram for malignant neoplasm of breast: Secondary | ICD-10-CM | POA: Insufficient documentation

## 2017-07-26 DIAGNOSIS — Z1239 Encounter for other screening for malignant neoplasm of breast: Secondary | ICD-10-CM

## 2017-07-26 DIAGNOSIS — R928 Other abnormal and inconclusive findings on diagnostic imaging of breast: Secondary | ICD-10-CM | POA: Diagnosis not present

## 2017-07-26 DIAGNOSIS — Z780 Asymptomatic menopausal state: Secondary | ICD-10-CM | POA: Diagnosis not present

## 2017-07-27 ENCOUNTER — Other Ambulatory Visit: Payer: Self-pay | Admitting: Family Medicine

## 2017-07-27 DIAGNOSIS — R928 Other abnormal and inconclusive findings on diagnostic imaging of breast: Secondary | ICD-10-CM

## 2017-08-02 ENCOUNTER — Ambulatory Visit: Payer: Medicare Other | Admitting: Family Medicine

## 2017-08-07 DIAGNOSIS — H2513 Age-related nuclear cataract, bilateral: Secondary | ICD-10-CM | POA: Diagnosis not present

## 2017-08-07 DIAGNOSIS — H5203 Hypermetropia, bilateral: Secondary | ICD-10-CM | POA: Diagnosis not present

## 2017-08-10 ENCOUNTER — Other Ambulatory Visit: Payer: Medicare Other

## 2017-08-10 ENCOUNTER — Ambulatory Visit: Payer: Medicare Other

## 2017-08-20 ENCOUNTER — Ambulatory Visit
Admission: RE | Admit: 2017-08-20 | Discharge: 2017-08-20 | Disposition: A | Discharge status: Home / Self Care | Payer: Medicare Other | Source: Ambulatory Visit | Attending: Family Medicine | Admitting: Family Medicine

## 2017-08-20 DIAGNOSIS — R928 Other abnormal and inconclusive findings on diagnostic imaging of breast: Secondary | ICD-10-CM | POA: Insufficient documentation

## 2017-09-07 DIAGNOSIS — H8110 Benign paroxysmal vertigo, unspecified ear: Secondary | ICD-10-CM | POA: Diagnosis not present

## 2017-10-01 ENCOUNTER — Ambulatory Visit: Payer: Medicare Other | Admitting: Internal Medicine

## 2017-10-01 NOTE — Progress Notes (Deleted)
Date:  10/01/2017   Name:  Cassandra Woodward   DOB:  1945/12/25   MRN:  [id number]   Chief Complaint: No chief complaint on file. Diabetes  She presents for her follow-up diabetic visit. She has type 2 (suspect just pre-diabetes) diabetes mellitus. Her disease course has been stable (has never been treated with diabetic medications). Symptoms are stable. Current diabetic treatment includes diet. She is compliant with treatment most of the time. Her weight is stable. An ACE inhibitor/angiotensin II receptor blocker is being taken. Eye exam is not current.  Hypertension  This is a chronic problem. The problem is unchanged. The problem is controlled. Past treatments include calcium channel blockers and ACE inhibitors.   Lab Results  Component Value Date   HGBA1C 6.0 (H) 04/02/2017     Review of Systems  Patient Active Problem List   Diagnosis Date Noted  . Macrocytosis 06/26/2017  . Dizziness 04/02/2017  . Allergic rhinitis 04/02/2017  . Leukocytosis 04/02/2017  . Atypical nevi 04/02/2017  . Diabetes mellitus type 2, controlled, without complications ([place]) 53/79/4327  . Hyperlipidemia 10/06/2016  . Generalized anxiety disorder with panic attacks 10/06/2016  . Chronic neck pain 10/06/2016  . Chronic back pain 10/06/2016  . Overweight (BMI 25.0-29.9) 10/06/2016  . PTSD (post-traumatic stress disorder) 10/06/2016  . Essential hypertension 09/26/2016  . Postmenopausal 09/26/2016    Prior to Admission medications   Medication Sig Start Date End Date Taking? Authorizing Provider  albuterol (PROVENTIL HFA;VENTOLIN HFA) 108 (90 Base) MCG/ACT inhaler Inhale 2 puffs into the lungs every 6 (six) hours as needed.    [provider]  ALPRAZolam Duanne Moron) 1 MG tablet Take 0.5 mg by mouth 3 (three) times daily as needed.    [provider]  blood glucose meter kit and supplies KIT Dispense Accucheck Nano Meter per Insurance/Patient request. Use up to four times daily as  directed. (FOR ICD 10: E11.4 ). 07/12/17   Plonk, Gwyndolyn Saxon, MD  Butalbital-APAP-Caffeine [phone] MG capsule Take 1 capsule by mouth every 6 (six) hours as needed.    [provider]  diphenoxylate-atropine (LOMOTIL) 2.5-0.025 MG tablet Take 0.025 tablets by mouth daily as needed. 04/17/17   [provider]  enalapril (VASOTEC) 5 MG tablet Take 1 tablet (5 mg total) by mouth daily. 10/04/16   Plonk, Gwyndolyn Saxon, MD  fluticasone (FLONASE) 50 MCG/ACT nasal spray Place 2 sprays into both nostrils daily.    [provider]  glucose blood test strip Use to check Blood Sugar up to 3 times daily with Accucheck devise.for Diabetes Mellitus 2 .ICD 10 E11.9 06/08/17   Adline Potter, MD  Lancets MISC Use with accucheck devise and strips to check Blood Sugar up to  3 times daily for ICD 10 H57.4 DM 2 uncomplicated 7/34/03   Plonk, Gwyndolyn Saxon, MD  loratadine (CLARITIN) 10 MG tablet Take 10 mg by mouth daily.    [provider]  prochlorperazine (COMPAZINE) 5 MG tablet Take 1 tablet (5 mg total) by mouth every 6 (six) hours as needed for nausea or vomiting. 07/12/17   Plonk, Gwyndolyn Saxon, MD  PROCTOSOL HC 2.5 % rectal cream Place 1 Tube rectally daily as needed. 06/17/17   [provider]  traMADol (ULTRAM) 50 MG tablet Take 1 tablet by mouth every 6 (six) hours as needed.    [provider]  verapamil (CALAN-SR) 240 MG CR tablet Take 1 tablet (240 mg total) by mouth daily. 10/04/16   Adline Potter, MD    Allergies  Allergen  Reactions  . Aspirin     Other reaction(s): Facial Swelling  . Bromsulphalein Anaphylaxis  . Pentazocine     Other reaction(s): PALPITATIONS Other reaction(s): Irregular Heart Rate  . Nsaids Other (See Comments)    Causes blisters in mouth   . Para-Benzoquinone     Other reaction(s): ANAPHYLACTIC  . Sulfa Antibiotics Other (See Comments)  . Tolmetin     Other reaction(s): Other (See Comments) Causes blisters in mouth     Past Surgical History:   Procedure Laterality Date  . APPENDECTOMY      Social History   Tobacco Use  . Smoking status: Never Smoker  . Smokeless tobacco: Never Used  . Tobacco comment: smoking cessation materials not required  Substance Use Topics  . Alcohol use: No  . Drug use: No     Medication list has been reviewed and updated.  No outpatient medications have been marked as taking for the 10/01/17 encounter (Appointment) with Glean Hess, MD.    [facility] 2/9 Scores 07/18/2017 07/18/2017 10/04/2016 10/04/2016  PHQ - 2 Score 0 0 0 -  PHQ- 9 Score 0 - - -  Exception Documentation - - - Other- indicate reason in comment box  Not completed - - - psych     Physical Exam  There were no vitals taken for this visit.  Assessment and Plan:  1. Controlled type 2 diabetes mellitus without complication, without long-term current use of insulin (HCC) ***  2. Essential hypertension ***

## 2017-10-03 ENCOUNTER — Ambulatory Visit: Payer: Medicare Other | Admitting: Internal Medicine

## 2017-10-03 ENCOUNTER — Ambulatory Visit: Payer: Medicare Other | Admitting: Family Medicine

## 2017-11-12 ENCOUNTER — Other Ambulatory Visit: Payer: Self-pay | Admitting: Family Medicine

## 2018-06-13 ENCOUNTER — Encounter: Payer: Self-pay | Admitting: Obstetrics and Gynecology

## 2018-06-26 ENCOUNTER — Ambulatory Visit (INDEPENDENT_AMBULATORY_CARE_PROVIDER_SITE_OTHER): Payer: Medicare Other | Admitting: Obstetrics and Gynecology

## 2018-06-26 ENCOUNTER — Other Ambulatory Visit: Payer: Self-pay

## 2018-06-26 ENCOUNTER — Encounter: Payer: Self-pay | Admitting: Obstetrics and Gynecology

## 2018-06-26 VITALS — BP 139/91 | HR 82 | Ht 64.0 in | Wt 161.0 lb

## 2018-06-26 DIAGNOSIS — N952 Postmenopausal atrophic vaginitis: Secondary | ICD-10-CM | POA: Diagnosis not present

## 2018-06-26 DIAGNOSIS — N362 Urethral caruncle: Secondary | ICD-10-CM | POA: Diagnosis not present

## 2018-06-26 MED ORDER — NYSTATIN 100000 UNIT/GM EX CREA: 1.0000 "application " | TOPICAL_CREAM | Freq: Two times a day (BID) | CUTANEOUS | 1 refills | Status: DC

## 2018-06-26 MED ORDER — ESTROGENS, CONJUGATED 0.625 MG/GM VA CREA: TOPICAL_CREAM | VAGINAL | 3 refills | Status: DC

## 2018-06-26 NOTE — Progress Notes (Signed)
Obstetrics & Gynecology Office Visit   Chief Complaint:  Chief Complaint  Patient presents with  . VAginal bumps    vaginal dryness    History of Present Illness: Cassandra Woodward is a 73 y.o. No obstetric history on file. who LMP was No LMP recorded. Patient is postmenopausal., presents today for a problem visit.   Patient complains of an abnormal vaginal bump that has been present just on the inside of the anterior vaigna. Feels these are more noticeable when standing.  Discharge described as: normal and physiologic. Vaginal symptoms include local irritation.   Other associated symptoms: iritation and adherent white discharge around the clitoral hood.Menstrual pattern: She has not bleeding concerns status post prior hysterectomy.  She denies recent antibiotic exposure, denies changes in soaps, detergents coinciding with the onset of her symptoms.  She has previously self treated or been under treatment by another provider for these symptoms. Has been treated with ketoconazole. Some improvement with application of this in overall irritation.  Review of Systems: Review of Systems  Constitutional: Negative.   Gastrointestinal: Negative.   Genitourinary: Negative.   Skin: Positive for itching and rash.    Past Medical History:  Past Medical History:  Diagnosis Date  . Anxiety   . Arrhythmia   . Arthritis   . Hyperlipidemia    Tryglicerides   . Hypertension   . PTSD (post-traumatic stress disorder)     Past Surgical History:  Past Surgical History:  Procedure Laterality Date  . APPENDECTOMY      Gynecologic History: No LMP recorded. Patient is postmenopausal.  Obstetric History: No obstetric history on file.  Family History:  Family History  Adopted: Yes  Problem Relation Age of Onset  . Breast cancer Neg Hx     Social History:  Social History   Socioeconomic History  . Marital status: Widowed    Spouse name: Not on file  . Number of children: 2  . Years  of education: Not on file  . Highest education level: Master's degree (e.g., MA, MS, MEng, MEd, MSW, MBA)  Occupational History  . Occupation: Retired  Scientific laboratory technician  . Financial resource strain: Not hard at all  . Food insecurity:    Worry: Never true    Inability: Never true  . Transportation needs:    Medical: No    Non-medical: No  Tobacco Use  . Smoking status: Never Smoker  . Smokeless tobacco: Never Used  . Tobacco comment: smoking cessation materials not required  Substance and Sexual Activity  . Alcohol use: No  . Drug use: No  . Sexual activity: Not Currently    Birth control/protection: Post-menopausal  Lifestyle  . Physical activity:    Days per week: 0 days    Minutes per session: 0 min  . Stress: Not at all  Relationships  . Social connections:    Talks on phone: Patient refused    Gets together: Patient refused    Attends religious service: Patient refused    Active member of club or organization: Patient refused    Attends meetings of clubs or organizations: Patient refused    Relationship status: Widowed  . Intimate partner violence:    Fear of current or ex partner: No    Emotionally abused: No    Physically abused: No    Forced sexual activity: No  Other Topics Concern  . Not on file  Social History Narrative  . Not on file    Allergies:  Allergies  Allergen Reactions  . Aspirin     Other reaction(s): Facial Swelling  . Bromsulphalein Anaphylaxis  . Pentazocine     Other reaction(s): PALPITATIONS Other reaction(s): Irregular Heart Rate  . Nsaids Other (See Comments)    Causes blisters in mouth   . Para-Benzoquinone     Other reaction(s): ANAPHYLACTIC  . Sulfa Antibiotics Other (See Comments)  . Tolmetin     Other reaction(s): Other (See Comments) Causes blisters in mouth     Medications: Prior to Admission medications   Medication Sig Start Date End Date Taking? Authorizing Provider  albuterol (PROVENTIL HFA;VENTOLIN HFA) 108 (90  Base) MCG/ACT inhaler Inhale 2 puffs into the lungs every 6 (six) hours as needed.   Yes [provider]  ALPRAZolam Duanne Moron) 1 MG tablet Take 0.5 mg by mouth 3 (three) times daily as needed.   Yes [provider]  blood glucose meter kit and supplies KIT Dispense Accucheck Nano Meter per Insurance/Patient request. Use up to four times daily as directed. (FOR ICD 10: E11.4 ). 07/12/17  Yes Plonk, Gwyndolyn Saxon, MD  Butalbital-APAP-Caffeine [phone] MG capsule Take 1 capsule by mouth every 6 (six) hours as needed.   Yes [provider]  diphenoxylate-atropine (LOMOTIL) 2.5-0.025 MG tablet Take 0.025 tablets by mouth daily as needed. 04/17/17  Yes [provider]  enalapril (VASOTEC) 5 MG tablet Take 1 tablet (5 mg total) by mouth daily. 10/04/16  Yes Plonk, Gwyndolyn Saxon, MD  fluticasone (FLONASE) 50 MCG/ACT nasal spray Place 2 sprays into both nostrils daily.   Yes [provider]  glucose blood test strip Use to check Blood Sugar up to 3 times daily with Accucheck devise.for Diabetes Mellitus 2 .ICD 10 E11.9 06/08/17  Yes Plonk, Gwyndolyn Saxon, MD  Lancets MISC Use with accucheck devise and strips to check Blood Sugar up to  3 times daily for ICD 10 Z76.7 DM 2 uncomplicated 3/41/93  Yes Plonk, Gwyndolyn Saxon, MD  loratadine (CLARITIN) 10 MG tablet Take 10 mg by mouth daily.   Yes [provider]  prochlorperazine (COMPAZINE) 5 MG tablet Take 1 tablet (5 mg total) by mouth every 6 (six) hours as needed for nausea or vomiting. 07/12/17  Yes Plonk, Gwyndolyn Saxon, MD  PROCTOSOL HC 2.5 % rectal cream Place 1 Tube rectally daily as needed. 06/17/17  Yes [provider]  traMADol (ULTRAM) 50 MG tablet Take 1 tablet by mouth every 6 (six) hours as needed.   Yes [provider]  verapamil (CALAN-SR) 240 MG CR tablet Take 1 tablet (240 mg total) by mouth daily. 10/04/16  Yes Plonk, Gwyndolyn Saxon, MD  conjugated estrogens (PREMARIN) vaginal cream 1 gram vaginally nightly at bedtime for 2  weeks, 1 gram vaginally every other night at bedtime for 2 weeks, then 1 gram vaginally twice weekly 06/26/18   Malachy Mood, MD  nystatin cream (MYCOSTATIN) Apply 1 application topically 2 (two) times daily. 06/26/18   Malachy Mood, MD    Physical Exam Vitals:  Vitals:   06/26/18 1408  BP: (!) 139/91  Pulse: 82   No LMP recorded. Patient is postmenopausal.  General: NAD, well nourished, appears stated aged 25: normocephalic, anicteric Pulmonary: No increased work of breathing Genitourinary:  External: Significant atrophic changes with loss of architecture of the labia minor (complete fusion with labia majora).  There is some adherent white discharge around the clitoral hood consistent with candida.  l Urethral meatus corresponds with area where patient notes bump and notable for urethral caruncle, normal Bartholin's and Skene's glands.  Rectal: deferred  Lymphatic: no evidence of inguinal lymphadenopathy Extremities: no edema, erythema, or tenderness Neurologic: Grossly intact Psychiatric: mood appropriate, affect full  Female chaperone present for pelvic  portions of the physical exam  Wet Prep: Clue Cells: Negative Fungal elements: Positive Trichomonas: Negative   Assessment: 73 y.o. vaginal atrophy and urethral caruncle  Plan: Problem List Items Addressed This Visit    None    Visit Diagnoses    Urethral caruncle    -  Primary   Vaginal atrophy         1) Urethral caruncle and vaginal atrophy - discussed two conditions are both related to hypoestrogenic state in menopause.  While they represent benign conditions they are treatable with vaginal estrogen formulations - Premarin Rx written and sample provided - stop ketoconazole given that prolonged use can cause further skin thinning and aggravated atrophic changes.  I see no evidence of lichen sclerosis   2) Vaginal/vulvar candidiasis  - Rx nystatin for candida around clitoral hood  3) Follow up 6-8  weeks  Malachy Mood, MD, [place], [place]

## 2018-06-26 NOTE — Patient Instructions (Signed)
Atrophic Vaginitis    Atrophic vaginitis is a condition in which the tissues that line the vagina become dry and thin. This condition is most common in women who have stopped having regular menstrual periods (are in menopause). This usually starts when a woman is 45-72 years old. That is the time when a woman's estrogen levels begin to drop (decrease).  Estrogen is a female hormone. It helps to keep the tissues of the vagina moist. It stimulates the vagina to produce a clear fluid that lubricates the vagina for sexual intercourse. This fluid also protects the vagina from infection. Lack of estrogen can cause the lining of the vagina to get thinner and dryer. The vagina may also shrink in size. It may become less elastic. Atrophic vaginitis tends to get worse over time as a woman's estrogen level drops.  What are the causes?  This condition is caused by the normal drop in estrogen that happens around the time of menopause.  What increases the risk?  Certain conditions or situations may lower a woman's estrogen level, leading to a higher risk for atrophic vaginitis. You are more likely to develop this condition if:   You are taking medicines that block estrogen.   You have had your ovaries removed.   You are being treated for cancer with X-ray (radiation) or medicines (chemotherapy).   You have given birth or are breastfeeding.   You are older than age 50.   You smoke.  What are the signs or symptoms?  Symptoms of this condition include:   Pain, soreness, or bleeding during sexual intercourse (dyspareunia).   Vaginal burning, irritation, or itching.   Pain or bleeding when a speculum is used in a vaginal exam (pelvic exam).   Having burning pain when passing urine.   Vaginal discharge that is brown or yellow.  In some cases, there are no symptoms.  How is this diagnosed?  This condition is diagnosed by taking a medical history and doing a physical exam. This will include a pelvic exam that checks the  vaginal tissues. Though rare, you may also have other tests, including:   A urine test.   A test that checks the acid balance in your vagina (acid balance test).  How is this treated?  Treatment for this condition depends on how severe your symptoms are. Treatment may include:   Using an over-the-counter vaginal lubricant before sex.   Using a long-acting vaginal moisturizer.   Using low-dose vaginal estrogen for moderate to severe symptoms that do not respond to other treatments. Options include creams, tablets, and inserts (vaginal rings). Before you use a vaginal estrogen, tell your health care provider if you have a history of:  ? Breast cancer.  ? Endometrial cancer.  ? Blood clots.  If you are not sexually active and your symptoms are very mild, you may not need treatment.  Follow these instructions at home:  Medicines   Take over-the-counter and prescription medicines only as told by your health care provider. Do not use herbal or alternative medicines unless your health care provider says that you can.   Use over-the-counter creams, lubricants, or moisturizers for dryness only as directed by your health care provider.  General instructions   If your atrophic vaginitis is caused by menopause, discuss all of your menopause symptoms and treatment options with your health care provider.   Do not douche.   Do not use products that can make your vagina dry. These include:  ? Scented feminine sprays.  ?   Scented tampons.  ? Scented soaps.   Vaginal intercourse can help to improve blood flow and elasticity of vaginal tissue. If it hurts to have sex, try using a lubricant or moisturizer just before having intercourse.  Contact a health care provider if:   Your discharge looks different than normal.   Your vagina has an unusual smell.   You have new symptoms.   Your symptoms do not improve with treatment.   Your symptoms get worse.  Summary   Atrophic vaginitis is a condition in which the tissues that  line the vagina become dry and thin. It is most common in women who have stopped having regular menstrual periods (are in menopause).   Treatment options include using vaginal lubricants and low-dose vaginal estrogen.   Contact a health care provider if your vagina has an unusual smell, or if your symptoms get worse or do not improve after treatment.  This information is not intended to replace advice given to you by your health care provider. Make sure you discuss any questions you have with your health care provider.  Document Released: 08/18/2014 Document Revised: 12/28/2016 Document Reviewed: 12/28/2016  Elsevier Interactive Patient Education  2019 [employer]

## 2018-07-04 ENCOUNTER — Telehealth: Payer: Self-pay

## 2018-07-04 NOTE — Telephone Encounter (Signed)
Pt is having problems with the meds rx'd by [facility]; stopped using the meds d/t stinging, burning, and advanced itching.  Please leave a msg as pt gets a lot of robo calls.  Please leave direct number you can be reached at so she doesn't have to go thru nurse line.  [phone]

## 2018-07-04 NOTE — Telephone Encounter (Signed)
advise

## 2018-07-08 NOTE — Telephone Encounter (Signed)
Nothing to do beyond premarin cream at present as she continues using irritation should get less

## 2018-07-08 NOTE — Telephone Encounter (Signed)
Pt aware.

## 2018-07-22 ENCOUNTER — Ambulatory Visit: Payer: Self-pay

## 2018-08-08 ENCOUNTER — Ambulatory Visit: Payer: Medicare Other | Admitting: Obstetrics and Gynecology

## 2018-08-28 ENCOUNTER — Ambulatory Visit (INDEPENDENT_AMBULATORY_CARE_PROVIDER_SITE_OTHER): Payer: Medicare Other | Admitting: Obstetrics and Gynecology

## 2018-08-28 ENCOUNTER — Other Ambulatory Visit: Payer: Self-pay

## 2018-08-28 ENCOUNTER — Ambulatory Visit: Payer: Medicare Other | Admitting: Obstetrics and Gynecology

## 2018-08-28 ENCOUNTER — Encounter: Payer: Self-pay | Admitting: Obstetrics and Gynecology

## 2018-08-28 DIAGNOSIS — B373 Candidiasis of vulva and vagina: Secondary | ICD-10-CM | POA: Diagnosis not present

## 2018-08-28 DIAGNOSIS — N952 Postmenopausal atrophic vaginitis: Secondary | ICD-10-CM

## 2018-08-28 DIAGNOSIS — B3731 Acute candidiasis of vulva and vagina: Secondary | ICD-10-CM

## 2018-08-28 MED ORDER — PRASTERONE 6.5 MG VA INST: 6.5000 mg | VAGINAL_INSERT | Freq: Every day | VAGINAL | 11 refills | Status: DC

## 2018-08-28 MED ORDER — FLUCONAZOLE 150 MG PO TABS: 150.0000 mg | ORAL_TABLET | ORAL | 0 refills | Status: AC

## 2018-08-28 NOTE — Progress Notes (Signed)
I connected with Cassandra Woodward on 08/29/18 at  1:30 PM EDT by telephone and verified that I am speaking with the correct person using two identifiers.   I discussed the limitations, risks, security and privacy concerns of performing an evaluation and management service by telephone and the availability of in person appointments. I also discussed with the patient that there may be a patient responsible charge related to this service. The patient expressed understanding and agreed to proceed.  The patient was at home I spoke with the patient from my workstation phone The names of people involved in this encounter were: Alan Ripper , and Malachy Mood   Obstetrics & Gynecology Office Visit   Chief Complaint:  Chief Complaint  Patient presents with  . Follow-up    vaginal bump/atrophic vagina/medication follow up    History of Present Illness: 73 y.o. No obstetric history on file. presenting for medication follow up for a diagnosis of vaginal atrophy and recurrent vaginal candida.  She is currently being managed with Topical nystatin and vaginal HRT.   The patient reports no improvement in symptoms.  On her current medication regimen has not had menstrual complaints secondary to history of prior hysterectomy.   She has not noted any side-effects or new symptoms.  States has not been particular diligent with premarin cream, also feels nystatin cream burns with application.  Review of Systems: Review of Systems  Constitutional: Negative.   Genitourinary: Negative.   Skin: Positive for itching and rash.    Past Medical History:  Past Medical History:  Diagnosis Date  . Anxiety   . Arrhythmia   . Arthritis   . Hyperlipidemia    Tryglicerides   . Hypertension   . PTSD (post-traumatic stress disorder)     Past Surgical History:  Past Surgical History:  Procedure Laterality Date  . APPENDECTOMY      Gynecologic History: No LMP recorded. Patient is postmenopausal.   Obstetric History: No obstetric history on file.  Family History:  Family History  Adopted: Yes  Problem Relation Age of Onset  . Breast cancer Neg Hx     Social History:  Social History   Socioeconomic History  . Marital status: Widowed    Spouse name: Not on file  . Number of children: 2  . Years of education: Not on file  . Highest education level: Master's degree (e.g., MA, MS, MEng, MEd, MSW, MBA)  Occupational History  . Occupation: Retired  Scientific laboratory technician  . Financial resource strain: Not hard at all  . Food insecurity:    Worry: Never true    Inability: Never true  . Transportation needs:    Medical: No    Non-medical: No  Tobacco Use  . Smoking status: Never Smoker  . Smokeless tobacco: Never Used  . Tobacco comment: smoking cessation materials not required  Substance and Sexual Activity  . Alcohol use: No  . Drug use: No  . Sexual activity: Not Currently    Birth control/protection: Post-menopausal  Lifestyle  . Physical activity:    Days per week: 0 days    Minutes per session: 0 min  . Stress: Not at all  Relationships  . Social connections:    Talks on phone: Patient refused    Gets together: Patient refused    Attends religious service: Patient refused    Active member of club or organization: Patient refused    Attends meetings of clubs or organizations: Patient refused    Relationship status: Widowed  .  Intimate partner violence:    Fear of current or ex partner: No    Emotionally abused: No    Physically abused: No    Forced sexual activity: No  Other Topics Concern  . Not on file  Social History Narrative  . Not on file    Allergies:  Allergies  Allergen Reactions  . Aspirin     Other reaction(s): Facial Swelling  . Bromsulphalein Anaphylaxis  . Pentazocine     Other reaction(s): PALPITATIONS Other reaction(s): Irregular Heart Rate  . Nsaids Other (See Comments)    Causes blisters in mouth   . Para-Benzoquinone     Other  reaction(s): ANAPHYLACTIC  . Sulfa Antibiotics Other (See Comments)  . Tolmetin     Other reaction(s): Other (See Comments) Causes blisters in mouth     Medications: Prior to Admission medications   Medication Sig Start Date End Date Taking? Authorizing Provider  albuterol (PROVENTIL HFA;VENTOLIN HFA) 108 (90 Base) MCG/ACT inhaler Inhale 2 puffs into the lungs every 6 (six) hours as needed.   Yes [provider]  ALPRAZolam Duanne Moron) 1 MG tablet Take 0.5 mg by mouth 3 (three) times daily as needed.   Yes [provider]  blood glucose meter kit and supplies KIT Dispense Accucheck Nano Meter per Insurance/Patient request. Use up to four times daily as directed. (FOR ICD 10: E11.4 ). 07/12/17  Yes Plonk, Gwyndolyn Saxon, MD  Butalbital-APAP-Caffeine [phone] MG capsule Take 1 capsule by mouth every 6 (six) hours as needed.   Yes [provider]  conjugated estrogens (PREMARIN) vaginal cream 1 gram vaginally nightly at bedtime for 2 weeks, 1 gram vaginally every other night at bedtime for 2 weeks, then 1 gram vaginally twice weekly 06/26/18  Yes Malachy Mood, MD  diphenoxylate-atropine (LOMOTIL) 2.5-0.025 MG tablet Take 0.025 tablets by mouth daily as needed. 04/17/17  Yes [provider]  enalapril (VASOTEC) 5 MG tablet Take 1 tablet (5 mg total) by mouth daily. 10/04/16  Yes Plonk, Gwyndolyn Saxon, MD  fluticasone (FLONASE) 50 MCG/ACT nasal spray Place 2 sprays into both nostrils daily.   Yes [provider]  glucose blood test strip Use to check Blood Sugar up to 3 times daily with Accucheck devise.for Diabetes Mellitus 2 .ICD 10 E11.9 06/08/17  Yes Plonk, Gwyndolyn Saxon, MD  Lancets MISC Use with accucheck devise and strips to check Blood Sugar up to  3 times daily for ICD 10 E83.1 DM 2 uncomplicated 09/01/59  Yes Plonk, Gwyndolyn Saxon, MD  loratadine (CLARITIN) 10 MG tablet Take 10 mg by mouth daily.   Yes [provider]  prochlorperazine (COMPAZINE) 5 MG tablet Take 1  tablet (5 mg total) by mouth every 6 (six) hours as needed for nausea or vomiting. 07/12/17  Yes Plonk, Gwyndolyn Saxon, MD  PROCTOSOL HC 2.5 % rectal cream Place 1 Tube rectally daily as needed. 06/17/17  Yes [provider]  traMADol (ULTRAM) 50 MG tablet Take 1 tablet by mouth every 6 (six) hours as needed.   Yes [provider]  verapamil (CALAN-SR) 240 MG CR tablet Take 1 tablet (240 mg total) by mouth daily. 10/04/16  Yes Plonk, Gwyndolyn Saxon, MD  nystatin cream (MYCOSTATIN) Apply 1 application topically 2 (two) times daily. Patient not taking: Reported on 08/28/2018 06/26/18   Malachy Mood, MD    Physical Exam Vitals: There were no vitals filed for this visit. No LMP recorded. Patient is postmenopausal.  No physical exam as this was a remote telephone visit to promote social distancing during the  current COVID-19 Pandemic   Assessment: 73 y.o. reccurent candida vaginitis and vaginal atrophy  Plan: Problem List Items Addressed This Visit    None    Visit Diagnoses    Recurrent candidiasis of vagina    -  Primary   Relevant Medications   fluconazole (DIFLUCAN) 150 MG tablet   Vaginal atrophy         1) Vaginal atrophy - switch to Intrarose from premarin.  Discussed daily application may be easier to remember and lack of applicator may be more comfortable for patient.  2) Vaginal Candida - rx diflucan  3) Telephone Time 19:25 minues  4) Return in about 4 weeks (around 09/25/2018) for  follow up ([place]).    Malachy Mood, MD, [facility], [facility] 08/28/2018, 1:41 PM

## 2018-09-10 ENCOUNTER — Telehealth: Payer: Self-pay

## 2018-09-10 NOTE — Telephone Encounter (Signed)
Pt aware, via voicemail, I have not received a PA request from pharmacy. Advised her to get in touch with pharmacy and tell them to fax over a PA form.

## 2018-09-10 NOTE — Telephone Encounter (Signed)
Patient inquiring about her vaginal suppository prior auth. Cb#[phone]

## 2018-09-11 NOTE — Telephone Encounter (Signed)
Tried to call pt before going to lunch. Unable to reach by phone, went straight to voicemail

## 2018-09-11 NOTE — Telephone Encounter (Signed)
LVM for patient to call back. I have samples w/discount card she can try to see if works before I try a PA.

## 2018-09-11 NOTE — Telephone Encounter (Signed)
I spoke to patient, samples being mailed to her, at her request, d/t COVID-19 and her age.

## 2018-09-11 NOTE — Telephone Encounter (Signed)
Cassandra Woodward from [facility] calling to request PA in interossa 6.5mg  vaginal insert as it's not covered by Medicare.  She states they are unable to fax PA request.  [phone]

## 2018-09-11 NOTE — Telephone Encounter (Signed)
Patient is returning missed call please advise? 

## 2018-11-18 ENCOUNTER — Other Ambulatory Visit: Payer: Self-pay | Admitting: Obstetrics and Gynecology

## 2018-11-19 NOTE — Telephone Encounter (Signed)
Advise

## 2019-04-20 IMAGING — MG MM DIGITAL DIAGNOSTIC UNILAT*R* W/ TOMO W/ CAD
8 series · 9 of 24 positions shown · non-contrast
Comparison: Previous exam(s).

CLINICAL DATA: Patient returns today to evaluate a possible RIGHT
breast distortion questioned on recent screening mammogram.

EXAM:
DIGITAL DIAGNOSTIC UNILATERAL RIGHT MAMMOGRAM WITH CAD AND TOMO

[R ML synth-2D]
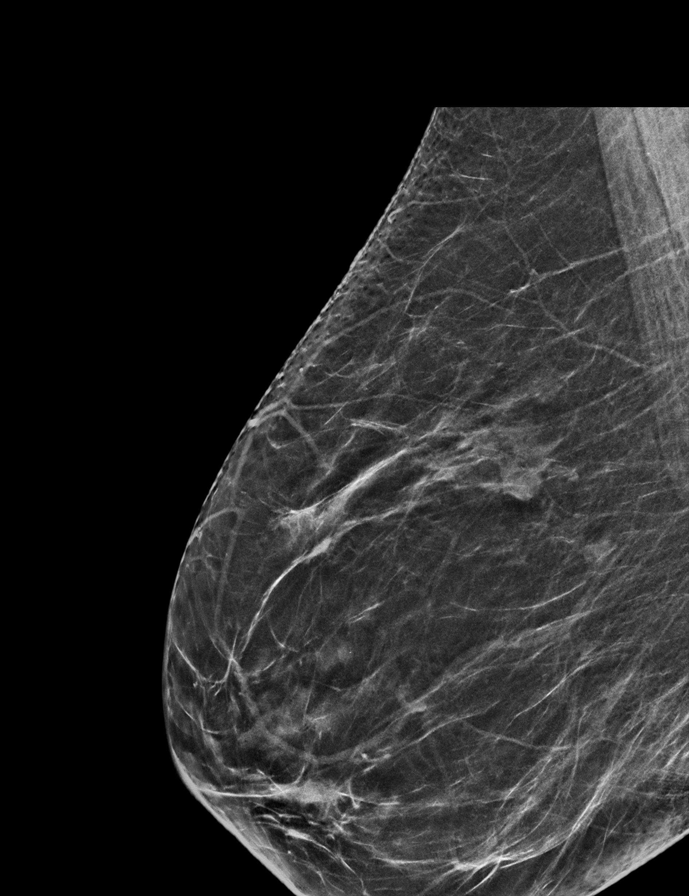

[R CC synth-2D (1 of 2)]
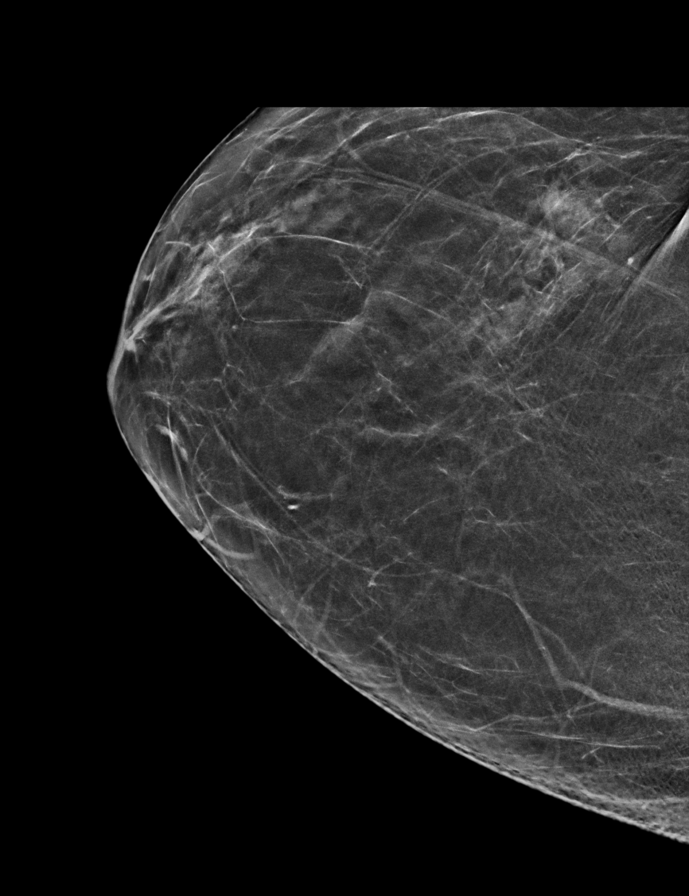

[R MLO synth-2D]
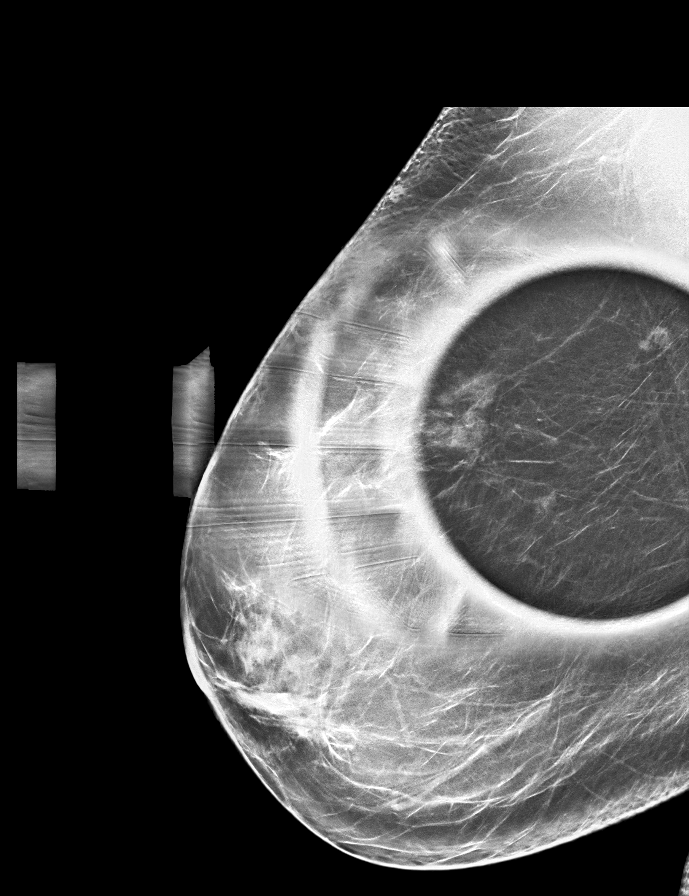

[R CC synth-2D (2 of 2)]
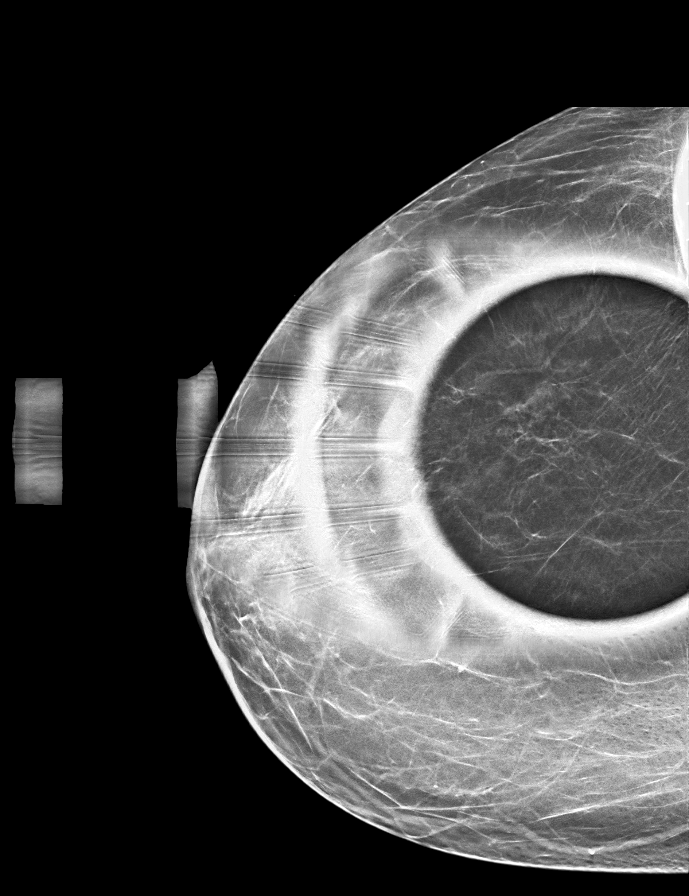

[R ML tomo · 2 of 60 frames shown]
[frame 20/60]
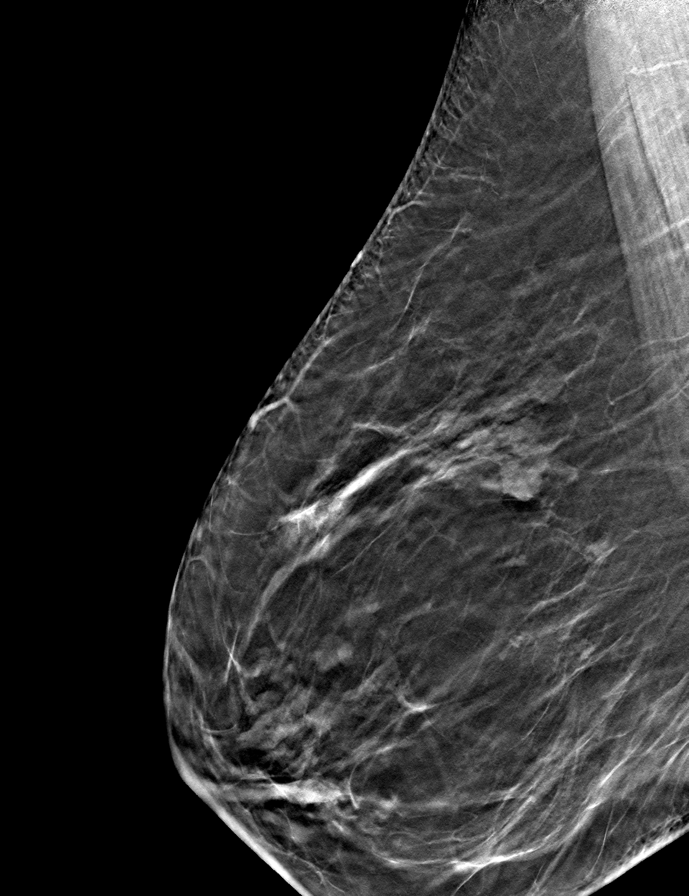
[frame 31/60]
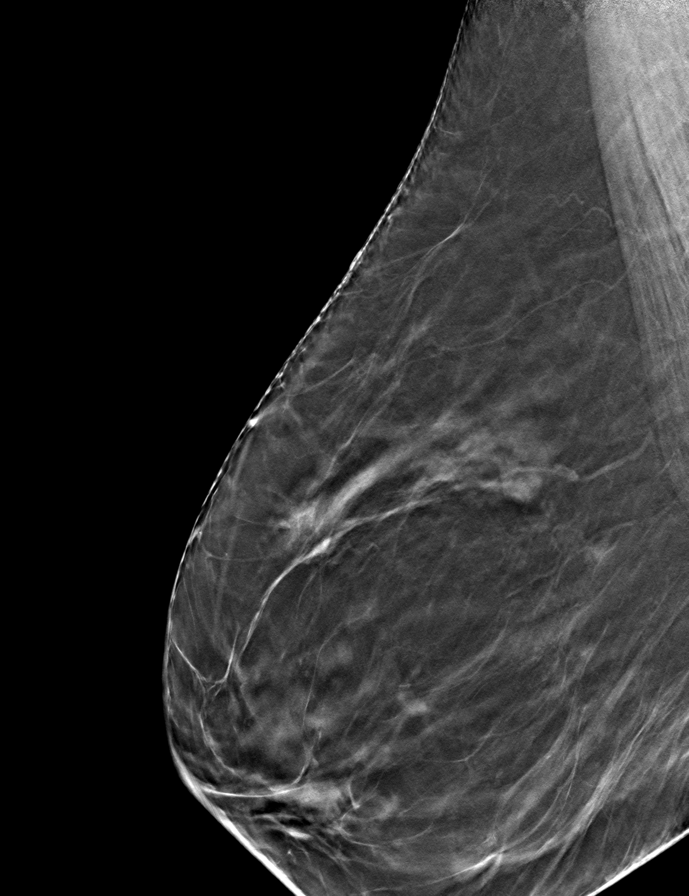

[R CC tomo (1 of 2) · tomo slice 23/46.0]
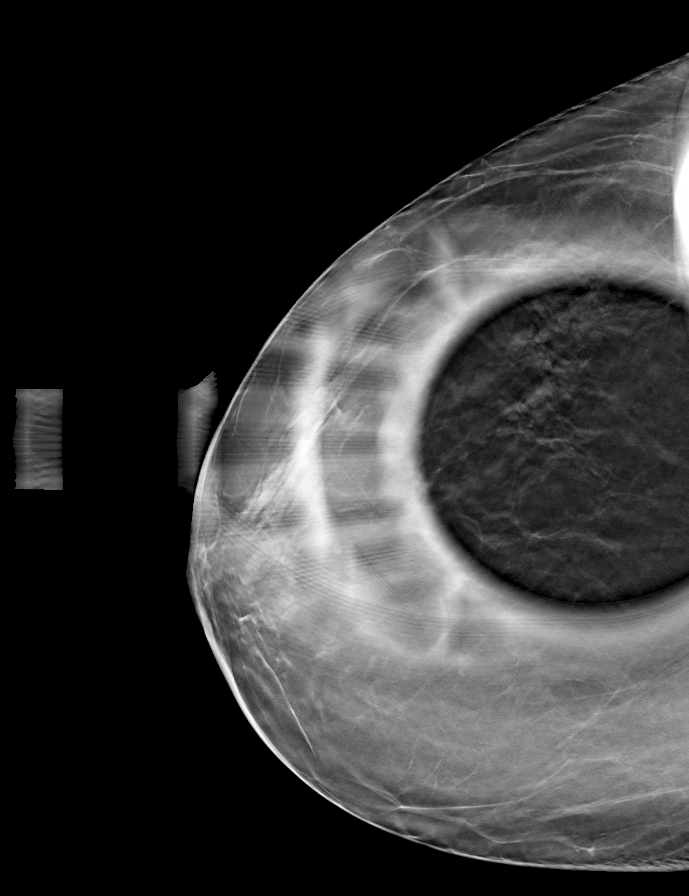

[R MLO tomo · tomo slice 28/55.0]
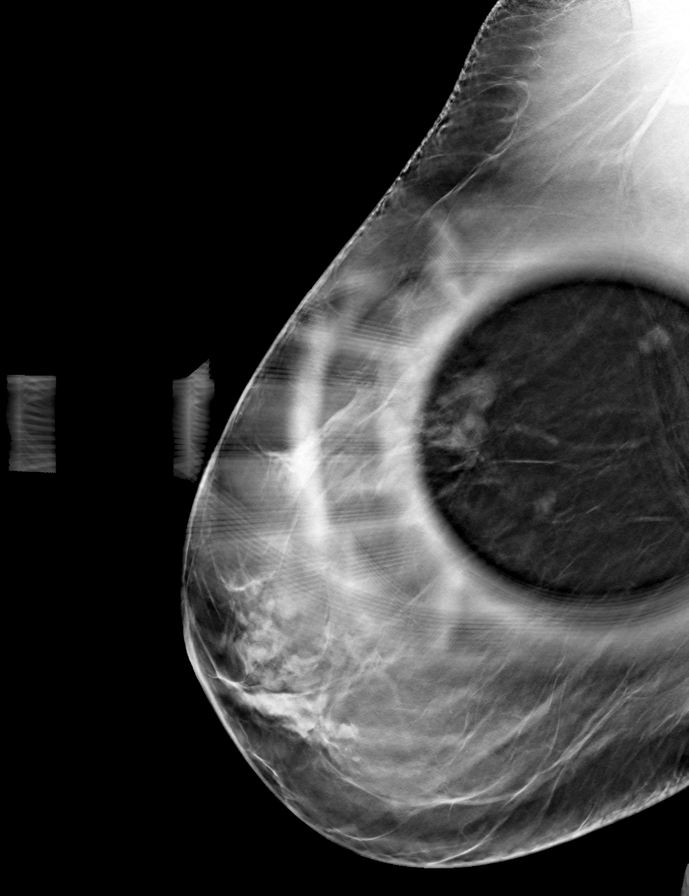

[R CC tomo (2 of 2) · tomo slice 29/56.0]
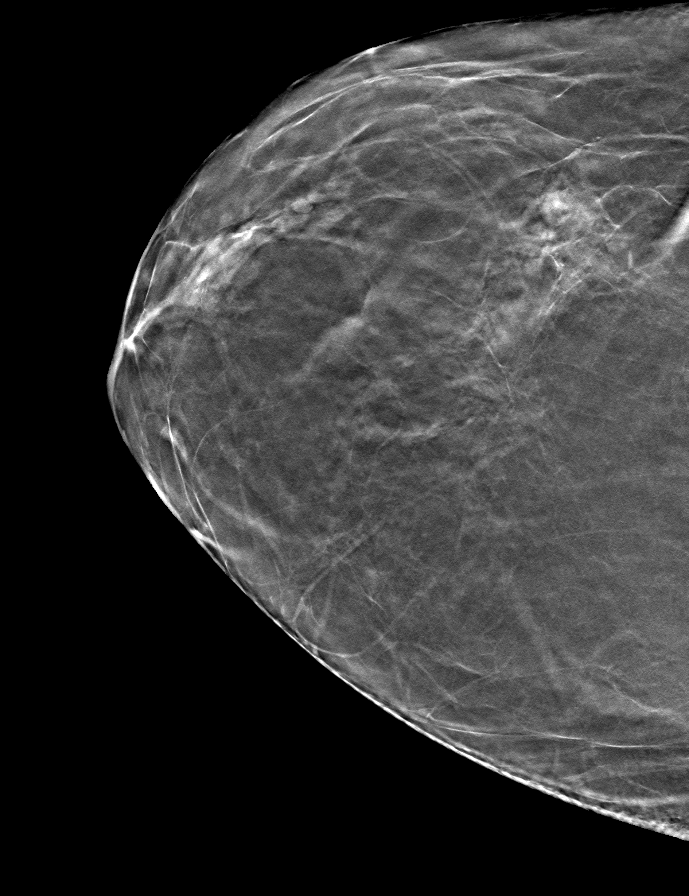

[9 of 24 positions shown; findings below may reference images not displayed]

ACR Breast Density Category b: There are scattered areas of
fibroglandular density.
FINDINGS: On today's additional diagnostic views, including spot compression
with 3D tomosynthesis, there is no persistent abnormality within the
RIGHT breast indicating superimposition of normal fibroglandular
tissues. There are no new dominant masses, suspicious calcifications
or secondary signs of malignancy identified on today's exam.

Mammographic images were processed with CAD.
IMPRESSION: No evidence of malignancy within the RIGHT breast. Patient may
return to routine annual bilateral screening mammogram schedule.

RECOMMENDATION:
Screening mammogram in one year.(Code:[id number])

I have discussed the findings and recommendations with the patient.
Results were also provided in writing at the conclusion of the
visit. If applicable, a reminder letter will be sent to the patient
regarding the next appointment.

BI-RADS CATEGORY  1: Negative.

## 2019-05-01 ENCOUNTER — Ambulatory Visit: Payer: Medicare Other | Admitting: Physical Therapy

## 2019-05-08 ENCOUNTER — Encounter: Payer: Medicare Other | Admitting: Physical Therapy

## 2019-05-15 ENCOUNTER — Encounter: Payer: Medicare Other | Admitting: Physical Therapy

## 2019-05-22 ENCOUNTER — Encounter: Payer: Medicare Other | Admitting: Physical Therapy

## 2019-05-29 ENCOUNTER — Encounter: Payer: Medicare Other | Admitting: Physical Therapy

## 2019-06-05 ENCOUNTER — Encounter: Payer: Medicare Other | Admitting: Physical Therapy

## 2019-06-12 ENCOUNTER — Encounter: Payer: Medicare Other | Admitting: Physical Therapy

## 2019-06-19 ENCOUNTER — Encounter: Payer: Medicare Other | Admitting: Physical Therapy

## 2020-01-22 ENCOUNTER — Ambulatory Visit: Payer: Medicare Other | Admitting: Obstetrics and Gynecology

## 2020-02-04 ENCOUNTER — Ambulatory Visit (INDEPENDENT_AMBULATORY_CARE_PROVIDER_SITE_OTHER): Payer: Medicare Other | Admitting: Obstetrics and Gynecology

## 2020-02-04 ENCOUNTER — Other Ambulatory Visit: Payer: Self-pay | Admitting: Obstetrics and Gynecology

## 2020-02-04 ENCOUNTER — Encounter: Payer: Self-pay | Admitting: Obstetrics and Gynecology

## 2020-02-04 ENCOUNTER — Other Ambulatory Visit: Payer: Self-pay

## 2020-02-04 VITALS — BP 146/84 | HR 90 | Wt 163.0 lb

## 2020-02-04 DIAGNOSIS — N952 Postmenopausal atrophic vaginitis: Secondary | ICD-10-CM | POA: Diagnosis not present

## 2020-02-04 DIAGNOSIS — N761 Subacute and chronic vaginitis: Secondary | ICD-10-CM | POA: Diagnosis not present

## 2020-02-04 MED ORDER — ESTRADIOL 0.1 MG/GM VA CREA: 0.5000 g | TOPICAL_CREAM | VAGINAL | 3 refills | Status: DC

## 2020-02-04 MED ORDER — TRIAMCINOLONE ACETONIDE 0.1 % EX CREA: 1.0000 "application " | TOPICAL_CREAM | Freq: Two times a day (BID) | CUTANEOUS | 1 refills | Status: DC

## 2020-02-04 MED ORDER — NYSTATIN 100000 UNIT/GM EX CREA: 1.0000 "application " | TOPICAL_CREAM | Freq: Two times a day (BID) | CUTANEOUS | 1 refills | Status: DC

## 2020-02-04 MED ORDER — NYSTATIN-TRIAMCINOLONE 100000-0.1 UNIT/GM-% EX CREA: 1.0000 "application " | TOPICAL_CREAM | Freq: Two times a day (BID) | CUTANEOUS | 1 refills | Status: DC

## 2020-02-04 NOTE — Progress Notes (Signed)
Obstetrics & Gynecology Office Visit   Chief Complaint:  Chief Complaint  Patient presents with  . Vaginal Discharge    History of Present Illness: 74 y.o. [place] female presenting up for chronic vaginitis.  Suspected candida component as well as vulvovaginal atrophy.  The patient was intolerant of Interosa capsules,  She has gotten some relief out of a topical preperation containing hyaluronic acid.  She presents today for follow up to evaluate current state of vaginitis as well as additional treatment options.  Her symptoms remain most prominent around the clitoral hood. Last HgbA1C was 6.1 on 08/11/2019.  Not currently sexually active.   Review of Systems: Review of Systems  Constitutional: Negative.   Gastrointestinal: Negative.   Genitourinary: Positive for dysuria. Negative for flank pain, frequency, hematuria and urgency.  Skin: Positive for itching and rash.     Past Medical History:  Past Medical History:  Diagnosis Date  . Anxiety   . Arrhythmia   . Arthritis   . Hyperlipidemia    Tryglicerides   . Hypertension   . PTSD (post-traumatic stress disorder)     Past Surgical History:  Past Surgical History:  Procedure Laterality Date  . APPENDECTOMY      Gynecologic History: No LMP recorded. Patient is postmenopausal.  Obstetric History: No obstetric history on file.  Family History:  Family History  Adopted: Yes  Problem Relation Age of Onset  . Breast cancer Neg Hx     Social History:  Social History   Socioeconomic History  . Marital status: Widowed    Spouse name: Not on file  . Number of children: 2  . Years of education: Not on file  . Highest education level: Master's degree (e.g., MA, MS, MEng, MEd, MSW, MBA)  Occupational History  . Occupation: Retired  Tobacco Use  . Smoking status: Never Smoker  . Smokeless tobacco: Never Used  . Tobacco comment: smoking cessation materials not required  Vaping Use  . Vaping Use: Never used    Substance and Sexual Activity  . Alcohol use: No  . Drug use: No  . Sexual activity: Not Currently    Birth control/protection: Post-menopausal  Other Topics Concern  . Not on file  Social History Narrative  . Not on file   Social Determinants of Health   Financial Resource Strain:   . Difficulty of Paying Living Expenses: Not on file  Food Insecurity:   . Worried About Charity fundraiser in the Last Year: Not on file  . Ran Out of Food in the Last Year: Not on file  Transportation Needs:   . Lack of Transportation (Medical): Not on file  . Lack of Transportation (Non-Medical): Not on file  Physical Activity:   . Days of Exercise per Week: Not on file  . Minutes of Exercise per Session: Not on file  Stress:   . Feeling of Stress : Not on file  Social Connections:   . Frequency of Communication with Friends and Family: Not on file  . Frequency of Social Gatherings with Friends and Family: Not on file  . Attends Religious Services: Not on file  . Active Member of Clubs or Organizations: Not on file  . Attends Archivist Meetings: Not on file  . Marital Status: Not on file  Intimate Partner Violence:   . Fear of Current or Ex-Partner: Not on file  . Emotionally Abused: Not on file  . Physically Abused: Not on file  . Sexually  Abused: Not on file    Allergies:  Allergies  Allergen Reactions  . Aspirin     Other reaction(s): Facial Swelling  . Bromsulphalein Anaphylaxis  . Pentazocine     Other reaction(s): PALPITATIONS Other reaction(s): Irregular Heart Rate  . Nsaids Other (See Comments)    Causes blisters in mouth   . Para-Benzoquinone     Other reaction(s): ANAPHYLACTIC  . Sulfa Antibiotics Other (See Comments)  . Tolmetin     Other reaction(s): Other (See Comments) Causes blisters in mouth     Medications: Prior to Admission medications   Medication Sig Start Date End Date Taking? Authorizing Provider  albuterol (PROVENTIL HFA;VENTOLIN HFA)  108 (90 Base) MCG/ACT inhaler Inhale 2 puffs into the lungs every 6 (six) hours as needed.   Yes [provider]  ALPRAZolam Duanne Moron) 1 MG tablet Take 0.5 mg by mouth 3 (three) times daily as needed.   Yes [provider]  blood glucose meter kit and supplies KIT Dispense Accucheck Nano Meter per Insurance/Patient request. Use up to four times daily as directed. (FOR ICD 10: E11.4 ). 07/12/17  Yes Plonk, Gwyndolyn Saxon, MD  Butalbital-APAP-Caffeine [phone] MG capsule Take 1 capsule by mouth every 6 (six) hours as needed.   Yes [provider]  diphenoxylate-atropine (LOMOTIL) 2.5-0.025 MG tablet Take 0.025 tablets by mouth daily as needed. 04/17/17  Yes [provider]  enalapril (VASOTEC) 5 MG tablet Take 1 tablet (5 mg total) by mouth daily. 10/04/16  Yes Plonk, Gwyndolyn Saxon, MD  fluticasone (FLONASE) 50 MCG/ACT nasal spray Place 2 sprays into both nostrils daily.   Yes [provider]  glucose blood test strip Use to check Blood Sugar up to 3 times daily with Accucheck devise.for Diabetes Mellitus 2 .ICD 10 E11.9 06/08/17  Yes Plonk, Gwyndolyn Saxon, MD  Lancets MISC Use with accucheck devise and strips to check Blood Sugar up to  3 times daily for ICD 10 B14.7 DM 2 uncomplicated 12/14/54  Yes Plonk, Gwyndolyn Saxon, MD  prochlorperazine (COMPAZINE) 5 MG tablet Take 1 tablet (5 mg total) by mouth every 6 (six) hours as needed for nausea or vomiting. 07/12/17  Yes Plonk, Gwyndolyn Saxon, MD  PROCTOSOL HC 2.5 % rectal cream Place 1 Tube rectally daily as needed. 06/17/17  Yes [provider]  traMADol (ULTRAM) 50 MG tablet Take 1 tablet by mouth every 6 (six) hours as needed.   Yes [provider]  verapamil (CALAN-SR) 240 MG CR tablet Take 1 tablet (240 mg total) by mouth daily. 10/04/16  Yes Plonk, Gwyndolyn Saxon, MD  loratadine (CLARITIN) 10 MG tablet Take 10 mg by mouth daily. Patient not taking: Reported on 02/04/2020    [provider]  Prasterone (INTRAROSA) 6.5 MG INST  Place 6.5 mg vaginally at bedtime. Patient not taking: Reported on 02/04/2020 08/28/18   Malachy Mood, MD    Physical Exam Vitals:  Vitals:   02/04/20 1402  BP: (!) 146/84  Pulse: 90  SpO2: 98%   No LMP recorded. Patient is postmenopausal.  General: NAD, well nourished, appears stated age 46: normocephalic, anicteric Pulmonary: No increased work of breathing Genitourinary:  External: External female genitalia with age related changes and prominent loss in architecture of the labia minor with almost complete fusion to the labia major posteriorly.  Normal urethral meatus, normal Bartholin's and Skene's glands.  The clitoral hood has a thick white exudate.    Rectal: deferred Extremities: no edema, erythema, or tenderness Neurologic: Grossly intact Psychiatric: mood appropriate, affect full  Female chaperone  present for pelvic  portions of the physical exam  Assessment: 74 y.o. with chronic vaginitis  Plan: Problem List Items Addressed This Visit    None    Visit Diagnoses    Chronic vaginitis    -  Primary   Relevant Orders   NuSwab BV and Candida, NAA   Vaginal atrophy         1) Chronic vaginitis - still suspect a candida component as well as contributing atrophic changes - NuSwab sent - nystatin/triamcinolone - estrace cream  2) A total of 15 minutes were spent in face-to-face contact with the patient during this encounter with over half of that time devoted to counseling and coordination of care.  3) Return in about 2 weeks (around 02/18/2020) for medication follow up.   Malachy Mood, MD, [facility], [facility] 02/04/2020, 2:48 PM

## 2020-02-07 LAB — NUSWAB BV AND CANDIDA, NAA
Candida albicans, NAA: NEGATIVE
Candida glabrata, NAA: NEGATIVE

## 2020-02-26 ENCOUNTER — Other Ambulatory Visit: Payer: Self-pay

## 2020-02-26 ENCOUNTER — Ambulatory Visit (INDEPENDENT_AMBULATORY_CARE_PROVIDER_SITE_OTHER): Payer: Medicare Other | Admitting: Obstetrics and Gynecology

## 2020-02-26 ENCOUNTER — Encounter: Payer: Self-pay | Admitting: Obstetrics and Gynecology

## 2020-02-26 VITALS — BP 147/88 | Ht 65.0 in | Wt 162.0 lb

## 2020-02-26 DIAGNOSIS — N761 Subacute and chronic vaginitis: Secondary | ICD-10-CM

## 2020-02-26 MED ORDER — FLUCONAZOLE 150 MG PO TABS: 150.0000 mg | ORAL_TABLET | ORAL | 0 refills | Status: DC

## 2020-02-26 NOTE — Progress Notes (Signed)
Obstetrics & Gynecology Office Visit   Chief Complaint:  Chief Complaint  Patient presents with  . Follow-up    History of Present Illness: 74 y.o. presenting for medication follow up for a diagnosis of chronic vaginitis and recurrent vaginal candida.  She is currently being managed with topical steroid (triamcinolone) and topical antifungal (nystatin) and vaginal HRT.   The patient reports improvement in symptoms but continued irritation.  On her current medication regimen no bleeding concerns.   She has not noted any side-effects or new symptoms.  Nuswab last visit negative.    Review of Systems: review of systems negative unless noted in HPI  Past Medical History:  Past Medical History:  Diagnosis Date  . Anxiety   . Arrhythmia   . Arthritis   . Hyperlipidemia    Tryglicerides   . Hypertension   . PTSD (post-traumatic stress disorder)     Past Surgical History:  Past Surgical History:  Procedure Laterality Date  . APPENDECTOMY      Gynecologic History: No LMP recorded. Patient is postmenopausal.  Obstetric History: No obstetric history on file.  Family History:  Family History  Adopted: Yes  Problem Relation Age of Onset  . Breast cancer Neg Hx     Social History:  Social History   Socioeconomic History  . Marital status: Widowed    Spouse name: Not on file  . Number of children: 2  . Years of education: Not on file  . Highest education level: Master's degree (e.g., MA, MS, MEng, MEd, MSW, MBA)  Occupational History  . Occupation: Retired  Tobacco Use  . Smoking status: Never Smoker  . Smokeless tobacco: Never Used  . Tobacco comment: smoking cessation materials not required  Vaping Use  . Vaping Use: Never used  Substance and Sexual Activity  . Alcohol use: No  . Drug use: No  . Sexual activity: Not Currently    Birth control/protection: Post-menopausal  Other Topics Concern  . Not on file  Social History Narrative  . Not on file    Social Determinants of Health   Financial Resource Strain:   . Difficulty of Paying Living Expenses: Not on file  Food Insecurity:   . Worried About Charity fundraiser in the Last Year: Not on file  . Ran Out of Food in the Last Year: Not on file  Transportation Needs:   . Lack of Transportation (Medical): Not on file  . Lack of Transportation (Non-Medical): Not on file  Physical Activity:   . Days of Exercise per Week: Not on file  . Minutes of Exercise per Session: Not on file  Stress:   . Feeling of Stress : Not on file  Social Connections:   . Frequency of Communication with Friends and Family: Not on file  . Frequency of Social Gatherings with Friends and Family: Not on file  . Attends Religious Services: Not on file  . Active Member of Clubs or Organizations: Not on file  . Attends Archivist Meetings: Not on file  . Marital Status: Not on file  Intimate Partner Violence:   . Fear of Current or Ex-Partner: Not on file  . Emotionally Abused: Not on file  . Physically Abused: Not on file  . Sexually Abused: Not on file    Allergies:  Allergies  Allergen Reactions  . Aspirin     Other reaction(s): Facial Swelling  . Bromsulphalein Anaphylaxis  . Pentazocine     Other reaction(s):  PALPITATIONS Other reaction(s): Irregular Heart Rate  . Nsaids Other (See Comments)    Causes blisters in mouth   . Para-Benzoquinone     Other reaction(s): ANAPHYLACTIC  . Sulfa Antibiotics Other (See Comments)  . Tolmetin     Other reaction(s): Other (See Comments) Causes blisters in mouth     Medications: Prior to Admission medications   Medication Sig Start Date End Date Taking? Authorizing Provider  albuterol (PROVENTIL HFA;VENTOLIN HFA) 108 (90 Base) MCG/ACT inhaler Inhale 2 puffs into the lungs every 6 (six) hours as needed.   Yes [provider]  ALPRAZolam Duanne Moron) 1 MG tablet Take 0.5 mg by mouth 3 (three) times daily as needed.   Yes [provider]  blood glucose meter kit and supplies KIT Dispense Accucheck Nano Meter per Insurance/Patient request. Use up to four times daily as directed. (FOR ICD 10: E11.4 ). 07/12/17  Yes Plonk, Gwyndolyn Saxon, MD  Butalbital-APAP-Caffeine [phone] MG capsule Take 1 capsule by mouth every 6 (six) hours as needed.   Yes [provider]  diphenoxylate-atropine (LOMOTIL) 2.5-0.025 MG tablet Take 0.025 tablets by mouth daily as needed. 04/17/17  Yes [provider]  enalapril (VASOTEC) 5 MG tablet Take 1 tablet (5 mg total) by mouth daily. 10/04/16  Yes Plonk, Gwyndolyn Saxon, MD  estradiol (ESTRACE VAGINAL) 0.1 MG/GM vaginal cream Place 0.5 g vaginally 2 (two) times a week. 02/05/20  Yes Malachy Mood, MD  fluticasone (FLONASE) 50 MCG/ACT nasal spray Place 2 sprays into both nostrils daily.   Yes [provider]  glucose blood test strip Use to check Blood Sugar up to 3 times daily with Accucheck devise.for Diabetes Mellitus 2 .ICD 10 E11.9 06/08/17  Yes Plonk, Gwyndolyn Saxon, MD  Lancets MISC Use with accucheck devise and strips to check Blood Sugar up to  3 times daily for ICD 10 X51.7 DM 2 uncomplicated 0/01/74  Yes Plonk, Gwyndolyn Saxon, MD  nystatin cream (MYCOSTATIN) Apply 1 application topically 2 (two) times daily. 02/04/20  Yes Malachy Mood, MD  prochlorperazine (COMPAZINE) 5 MG tablet Take 1 tablet (5 mg total) by mouth every 6 (six) hours as needed for nausea or vomiting. 07/12/17  Yes Plonk, Gwyndolyn Saxon, MD  PROCTOSOL HC 2.5 % rectal cream Place 1 Tube rectally daily as needed. 06/17/17  Yes [provider]  traMADol (ULTRAM) 50 MG tablet Take 1 tablet by mouth every 6 (six) hours as needed.   Yes [provider]  triamcinolone cream (KENALOG) 0.1 % Apply 1 application topically 2 (two) times daily. 02/04/20  Yes Malachy Mood, MD  verapamil (CALAN-SR) 240 MG CR tablet Take 1 tablet (240 mg total) by mouth daily. 10/04/16  Yes Plonk, Gwyndolyn Saxon, MD  loratadine (CLARITIN)  10 MG tablet Take 10 mg by mouth daily. Patient not taking: Reported on 02/04/2020    [provider]    Physical Exam Vitals:  Vitals:   02/26/20 1418  BP: (!) 147/88   No LMP recorded. Patient is postmenopausal.  General: NAD, well nourished, appears stated age 40: normocephalic, anicteric Pulmonary: No increased work of breathing Genitourinary:  External: Normal external female genitalia.  Normal urethral meatus, normal  Bartholin's and Skene's glands.  The irritation and white exudate buildup under the clitoral hood is no longer evident Extremities: no edema, erythema, or tenderness Neurologic: Grossly intact Psychiatric: mood appropriate, affect full  Female chaperone present for pelvic  portions of the physical exam  Assessment: 74 y.o. with chronic vaginitis  Plan: Problem List Items Addressed This Visit  None    Visit Diagnoses    Chronic vaginitis    -  Primary     1) Chronic vaginitis - Good improvement in symptoms continue topical triamcinolone/nystatin.  4 week course of once weekly diflucan  2) A total of 15 minutes were spent in face-to-face contact with the patient during this encounter with over half of that time devoted to counseling and coordination of care.  3) Return in about 4 weeks (around 03/25/2020) for medicaton follow up in person.   Malachy Mood, MD, [facility], [facility] 02/26/2020, 2:32 PM

## 2020-03-25 ENCOUNTER — Ambulatory Visit: Payer: Medicare Other | Admitting: Obstetrics and Gynecology

## 2020-05-23 ENCOUNTER — Other Ambulatory Visit: Payer: Self-pay | Admitting: Obstetrics and Gynecology

## 2020-05-24 ENCOUNTER — Other Ambulatory Visit: Payer: Self-pay | Admitting: Obstetrics and Gynecology

## 2020-05-24 MED ORDER — TRIAMCINOLONE ACETONIDE 0.1 % EX CREA: 1.0000 "application " | TOPICAL_CREAM | Freq: Two times a day (BID) | CUTANEOUS | 1 refills | Status: DC

## 2020-06-25 ENCOUNTER — Other Ambulatory Visit: Payer: Self-pay | Admitting: Obstetrics and Gynecology

## 2020-06-25 NOTE — Telephone Encounter (Signed)
Please advise 

## 2020-07-25 ENCOUNTER — Other Ambulatory Visit: Payer: Self-pay | Admitting: Obstetrics and Gynecology

## 2020-10-22 ENCOUNTER — Other Ambulatory Visit: Payer: Self-pay | Admitting: Obstetrics and Gynecology

## 2020-12-15 ENCOUNTER — Telehealth: Payer: Medicare Other

## 2020-12-15 NOTE — Telephone Encounter (Signed)
Pt and Cassandra Woodward her service dog calling; needs refill of yeast inf medication; has a flare up in the same area again; it doesn't seem to want to heal.  [phone]  [facility]

## 2020-12-15 NOTE — Telephone Encounter (Signed)
Adv pt to call pharm for refill of Kenolog; pt will also get Monastat to use on the outside as well.  Pt aware if doesn't get better or worsens to come in and be seen.

## 2020-12-16 ENCOUNTER — Other Ambulatory Visit: Payer: Self-pay | Admitting: Obstetrics and Gynecology

## 2021-01-10 ENCOUNTER — Other Ambulatory Visit: Payer: Self-pay | Admitting: Obstetrics and Gynecology

## 2021-02-14 ENCOUNTER — Other Ambulatory Visit: Payer: Self-pay | Admitting: Obstetrics and Gynecology

## 2021-02-16 ENCOUNTER — Other Ambulatory Visit: Payer: Self-pay | Admitting: Nurse Practitioner

## 2021-02-16 DIAGNOSIS — M810 Age-related osteoporosis without current pathological fracture: Secondary | ICD-10-CM

## 2021-02-16 DIAGNOSIS — Z1231 Encounter for screening mammogram for malignant neoplasm of breast: Secondary | ICD-10-CM

## 2021-04-16 ENCOUNTER — Other Ambulatory Visit: Payer: Self-pay | Admitting: Obstetrics and Gynecology

## 2021-04-25 ENCOUNTER — Ambulatory Visit
Admission: RE | Admit: 2021-04-25 | Discharge: 2021-04-25 | Disposition: A | Discharge status: Home / Self Care | Payer: Medicare Other | Source: Ambulatory Visit | Attending: Nurse Practitioner | Admitting: Nurse Practitioner

## 2021-04-25 ENCOUNTER — Other Ambulatory Visit: Payer: Self-pay

## 2021-04-25 DIAGNOSIS — Z780 Asymptomatic menopausal state: Secondary | ICD-10-CM | POA: Diagnosis not present

## 2021-04-25 DIAGNOSIS — Z1231 Encounter for screening mammogram for malignant neoplasm of breast: Secondary | ICD-10-CM

## 2021-04-25 DIAGNOSIS — Z13820 Encounter for screening for osteoporosis: Secondary | ICD-10-CM | POA: Insufficient documentation

## 2021-04-25 DIAGNOSIS — M810 Age-related osteoporosis without current pathological fracture: Secondary | ICD-10-CM | POA: Insufficient documentation

## 2021-04-25 DIAGNOSIS — Z85828 Personal history of other malignant neoplasm of skin: Secondary | ICD-10-CM | POA: Insufficient documentation

## 2021-04-25 HISTORY — DX: Malignant (primary) neoplasm, unspecified: C80.1

## 2021-07-06 ENCOUNTER — Ambulatory Visit (INDEPENDENT_AMBULATORY_CARE_PROVIDER_SITE_OTHER): Payer: Medicare Other | Admitting: Internal Medicine

## 2021-07-06 ENCOUNTER — Encounter: Payer: Self-pay | Admitting: Internal Medicine

## 2021-07-06 ENCOUNTER — Other Ambulatory Visit: Payer: Self-pay

## 2021-07-06 VITALS — BP 137/81 | HR 75 | Ht 65.0 in | Wt 143.4 lb

## 2021-07-06 DIAGNOSIS — J302 Other seasonal allergic rhinitis: Secondary | ICD-10-CM

## 2021-07-06 DIAGNOSIS — Z780 Asymptomatic menopausal state: Secondary | ICD-10-CM

## 2021-07-06 DIAGNOSIS — I10 Essential (primary) hypertension: Secondary | ICD-10-CM

## 2021-07-06 DIAGNOSIS — E663 Overweight: Secondary | ICD-10-CM

## 2021-07-06 DIAGNOSIS — E785 Hyperlipidemia, unspecified: Secondary | ICD-10-CM | POA: Diagnosis not present

## 2021-07-06 DIAGNOSIS — E039 Hypothyroidism, unspecified: Secondary | ICD-10-CM

## 2021-07-06 MED ORDER — ROSUVASTATIN CALCIUM 10 MG PO TABS: 10.0000 mg | ORAL_TABLET | Freq: Every day | ORAL | 3 refills | Status: DC

## 2021-07-06 NOTE — Progress Notes (Signed)
? ?New Patient Office Visit ? ?Subjective:  ?Patient ID: Cassandra Woodward, female    DOB: [birth year]  Age: 76 y.o. MRN: [id number] ? ?CC:  ?Chief Complaint  ?Patient presents with  ? New Patient (Initial Visit)  ? ? ?HPI ?Patient presents for new pt ? ?Past Medical History:  ?Diagnosis Date  ? Anxiety   ? Arrhythmia   ? Arthritis   ? Cancer [facility])   ? skin ca  ? Hyperlipidemia   ? Tryglicerides   ? Hypertension   ? PTSD (post-traumatic stress disorder)   ? ? ? ?Current Outpatient Medications:  ?  ALPRAZolam (XANAX) 1 MG tablet, Take 0.5 mg by mouth 3 (three) times daily as needed., Disp: , Rfl:  ?  Butalbital-APAP-Caffeine 50-325-40 MG capsule, Take 1 capsule by mouth every 6 (six) hours as needed., Disp: , Rfl:  ?  enalapril (VASOTEC) 5 MG tablet, Take 1 tablet (5 mg total) by mouth daily., Disp: 90 tablet, Rfl: 3 ?  estradiol (ESTRACE) 0.1 MG/GM vaginal cream, PLACE 0.5 G VAGINALLY 2 (TWO) TIMES A WEEK., Disp: 42.5 g, Rfl: 3 ?  fluticasone (FLONASE) 50 MCG/ACT nasal spray, Place 2 sprays into both nostrils daily., Disp: , Rfl:  ?  prochlorperazine (COMPAZINE) 5 MG tablet, Take 1 tablet (5 mg total) by mouth every 6 (six) hours as needed for nausea or vomiting., Disp: 30 tablet, Rfl: 0 ?  PROCTOSOL HC 2.5 % rectal cream, Place 1 Tube rectally daily as needed., Disp: , Rfl:  ?  rosuvastatin (CRESTOR) 10 MG tablet, Take 1 tablet (10 mg total) by mouth daily., Disp: 90 tablet, Rfl: 3 ?  traMADol (ULTRAM) 50 MG tablet, Take 1 tablet by mouth every 6 (six) hours as needed., Disp: , Rfl:  ?  verapamil (CALAN-SR) 240 MG CR tablet, Take 1 tablet (240 mg total) by mouth daily., Disp: 90 tablet, Rfl: 3  ? ?Past Surgical History:  ?Procedure Laterality Date  ? APPENDECTOMY    ? ? ?Family History  ?Adopted: Yes  ?Problem Relation Age of Onset  ? Breast cancer Neg Hx   ? ? ?Social History  ? ?Socioeconomic History  ? Marital status: Widowed  ?  Spouse name: Not on file  ? Number of children: 2  ? Years of education: Not on file  ?  Highest education level: Master's degree (e.g., MA, MS, MEng, MEd, MSW, MBA)  ?Occupational History  ? Occupation: Retired  ?Tobacco Use  ? Smoking status: Never  ? Smokeless tobacco: Never  ? Tobacco comments:  ?  smoking cessation materials not required  ?Vaping Use  ? Vaping Use: Never used  ?Substance and Sexual Activity  ? Alcohol use: No  ? Drug use: No  ? Sexual activity: Not Currently  ?  Birth control/protection: Post-menopausal  ?Other Topics Concern  ? Not on file  ?Social History Narrative  ? Not on file  ? ?Social Determinants of Health  ? ?Financial Resource Strain: Not on file  ?Food Insecurity: Not on file  ?Transportation Needs: Not on file  ?Physical Activity: Not on file  ?Stress: Not on file  ?Social Connections: Not on file  ?Intimate Partner Violence: Not on file  ? ? ?ROS ?Review of Systems  ?Constitutional:  Negative for diaphoresis.  ?HENT:  Negative for dental problem.   ?Eyes:  Negative for pain.  ?Respiratory:  Negative for chest tightness.   ?Cardiovascular:   ?     Coronary  calcification  ?Allergic/Immunologic: Negative for food allergies.  ?Neurological:  Negative for light-headedness.  ?Hematological:  Negative for adenopathy.  ?Psychiatric/Behavioral:  Negative for agitation and behavioral problems.   ? ?Objective:  ? ?Today's Vitals: BP 137/81   Pulse 75   Ht 5' 5"  (1.651 m)   Wt 143 lb 6.4 oz (65 kg)   BMI 23.86 kg/m?  ? ?Physical Exam ?Constitutional:   ?   Appearance: She is obese.  ?HENT:  ?   Head: Normocephalic.  ?   Nose: Nose normal.  ?Eyes:  ?   Pupils: Pupils are equal, round, and reactive to light.  ?Cardiovascular:  ?   Rate and Rhythm: Normal rate.  ?Pulmonary:  ?   Effort: Pulmonary effort is normal.  ?Abdominal:  ?   General: Bowel sounds are normal.  ?   Palpations: Abdomen is soft.  ?Musculoskeletal:     ?   General: Normal range of motion.  ?Neurological:  ?   General: No focal deficit present.  ? ? ?Assessment & Plan:  ? ?Problem List Items Addressed This  Visit   ? ?  ? Cardiovascular and Mediastinum  ? Essential hypertension  ? Relevant Medications  ? rosuvastatin (CRESTOR) 10 MG tablet  ?  ? Respiratory  ? Allergic rhinitis  ?  Take Claritin 5 mg p.o. daily as needed ?  ?  ?  ? Other  ? Postmenopausal  ?  Stable at the present time ?  ?  ? Hyperlipidemia  ?  Patient was started on statin ?  ?  ? Relevant Medications  ? rosuvastatin (CRESTOR) 10 MG tablet  ? Overweight (BMI 25.0-29.9)  ?  - I encouraged the patient to lose weight.  ?- I educated them on making healthy dietary choices including eating more fruits and vegetables and less fried foods. ?- I encouraged the patient to exercise more, and educated on the benefits of exercise including weight loss, diabetes prevention, and hypertension prevention.  ? Dietary counseling with a registered dietician ? Referral to a weight management support group (e.g. Weight Watchers, Overeaters Anonymous) ?? If your BMI is greater than 29 or you have gained more than 15 pounds you should work on weight loss. ?? Attend a healthy cooking class  ?  ?  ? ?Other Visit Diagnoses   ? ? Hypothyroidism, unspecified type    -  Primary  ? Relevant Orders  ? TSH  ? T3  ? T4  ? ?  ? ? ?Outpatient Encounter Medications as of 07/06/2021  ?Medication Sig  ? ALPRAZolam (XANAX) 1 MG tablet Take 0.5 mg by mouth 3 (three) times daily as needed.  ? Butalbital-APAP-Caffeine 50-325-40 MG capsule Take 1 capsule by mouth every 6 (six) hours as needed.  ? enalapril (VASOTEC) 5 MG tablet Take 1 tablet (5 mg total) by mouth daily.  ? estradiol (ESTRACE) 0.1 MG/GM vaginal cream PLACE 0.5 G VAGINALLY 2 (TWO) TIMES A WEEK.  ? fluticasone (FLONASE) 50 MCG/ACT nasal spray Place 2 sprays into both nostrils daily.  ? prochlorperazine (COMPAZINE) 5 MG tablet Take 1 tablet (5 mg total) by mouth every 6 (six) hours as needed for nausea or vomiting.  ? PROCTOSOL HC 2.5 % rectal cream Place 1 Tube rectally daily as needed.  ? rosuvastatin (CRESTOR) 10 MG tablet Take  1 tablet (10 mg total) by mouth daily.  ? traMADol (ULTRAM) 50 MG tablet Take 1 tablet by mouth every 6 (six) hours as needed.  ? verapamil (CALAN-SR) 240 MG CR tablet Take 1 tablet (240 mg total) by  mouth daily.  ? [DISCONTINUED] albuterol (PROVENTIL HFA;VENTOLIN HFA) 108 (90 Base) MCG/ACT inhaler Inhale 2 puffs into the lungs every 6 (six) hours as needed.  ? [DISCONTINUED] blood glucose meter kit and supplies KIT Dispense Accucheck Nano Meter per Insurance/Patient request. Use up to four times daily as directed. (FOR ICD 10: E11.4 ).  ? [DISCONTINUED] diphenoxylate-atropine (LOMOTIL) 2.5-0.025 MG tablet Take 0.025 tablets by mouth daily as needed.  ? [DISCONTINUED] fluconazole (DIFLUCAN) 150 MG tablet TAKE 1 TABLET BY MOUTH ONCE A WEEK FOR 4 DOSES.  ? [DISCONTINUED] glucose blood test strip Use to check Blood Sugar up to 3 times daily with Accucheck devise.for Diabetes Mellitus 2 .ICD 10 E11.9  ? [DISCONTINUED] Lancets MISC Use with accucheck devise and strips to check Blood Sugar up to  3 times daily for ICD 10 H88.5 DM 2 uncomplicated  ? [DISCONTINUED] loratadine (CLARITIN) 10 MG tablet Take 10 mg by mouth daily.  ? [DISCONTINUED] nystatin cream (MYCOSTATIN) Apply 1 application topically 2 (two) times daily.  ? [DISCONTINUED] triamcinolone cream (KENALOG) 0.1 % APPLY TO AFFECTED AREA TWICE A DAY  ? ?No facility-administered encounter medications on file as of 07/06/2021.  ? ? ?Follow-up: No follow-ups on file.  ? ?Cletis Athens, MD ?

## 2021-07-06 NOTE — Assessment & Plan Note (Signed)
Patient was started on statin ?

## 2021-07-06 NOTE — Assessment & Plan Note (Signed)
Take Claritin 5 mg p.o. daily as needed ?

## 2021-07-06 NOTE — Assessment & Plan Note (Signed)
Stable at the present time. 

## 2021-07-06 NOTE — Assessment & Plan Note (Signed)

## 2021-07-07 LAB — T4: T4, Total: 6.7 ug/dL (ref 5.1–11.9)

## 2021-07-07 LAB — T3: T3, Total: 105 ng/dL (ref 76–181)

## 2021-07-07 LAB — TSH: TSH: 3.29 mIU/L (ref 0.40–4.50)

## 2021-08-03 ENCOUNTER — Telehealth: Payer: Self-pay

## 2021-08-03 NOTE — Telephone Encounter (Signed)
Pt called triage today needing refill on her estrogen cream. AMS pt. Tried to call pt , LM with her to call back. I do not think we can refill this since she doesn't have an appt with a provider. ?

## 2022-11-20 LAB — COLOGUARD: COLOGUARD: NEGATIVE

## 2022-12-28 ENCOUNTER — Encounter: Payer: Self-pay | Admitting: Internal Medicine

## 2023-02-01 ENCOUNTER — Telehealth: Payer: Self-pay | Admitting: *Deleted

## 2023-02-01 NOTE — Telephone Encounter (Signed)
 LMOVM to verify card hx

## 2023-02-02 ENCOUNTER — Ambulatory Visit: Payer: 59 | Admitting: Cardiology

## 2023-04-06 ENCOUNTER — Ambulatory Visit: Payer: 59 | Admitting: Cardiology

## 2023-04-16 ENCOUNTER — Ambulatory Visit: Payer: 59 | Admitting: Cardiology

## 2023-06-08 ENCOUNTER — Ambulatory Visit: Payer: 59 | Admitting: Cardiology

## 2023-06-20 ENCOUNTER — Telehealth: Payer: Self-pay | Admitting: *Deleted

## 2023-06-20 NOTE — Telephone Encounter (Signed)
 LMOVM to verify card hx.

## 2023-06-27 ENCOUNTER — Ambulatory Visit: Payer: 59 | Attending: Internal Medicine | Admitting: Internal Medicine

## 2023-06-27 ENCOUNTER — Encounter: Payer: Self-pay | Admitting: Internal Medicine

## 2023-06-27 VITALS — BP 140/82 | HR 68 | Ht 64.0 in | Wt 152.2 lb

## 2023-06-27 DIAGNOSIS — E7800 Pure hypercholesterolemia, unspecified: Secondary | ICD-10-CM

## 2023-06-27 DIAGNOSIS — R079 Chest pain, unspecified: Secondary | ICD-10-CM | POA: Diagnosis not present

## 2023-06-27 DIAGNOSIS — I7090 Unspecified atherosclerosis: Secondary | ICD-10-CM

## 2023-06-27 NOTE — Patient Instructions (Signed)
 Medication Instructions: Your physician recommends that you continue on your current medications as directed. Please refer to the Current Medication list given to you today.   *If you need a refill on your cardiac medications before your next appointment, please call your pharmacy*   Lab Work: No labs   If you have labs (blood work) drawn today and your tests are completely normal, you will receive your results only by: MyChart Message (if you have MyChart) OR A paper copy in the mail If you have any lab test that is abnormal or we need to change your treatment, we will call you to review the results.   Testing/Procedures:  [facility] MYOVIEW  Your Provider has ordered a Stress Test with nuclear imaging. The purpose of this test is to evaluate the blood supply to your heart muscle. This procedure is referred to as a "Non-Invasive Stress Test." This is because other than having an IV started in your vein, nothing is inserted or "invades" your body. Cardiac stress tests are done to find areas of poor blood flow to the heart by determining the extent of coronary artery disease (CAD). Some patients exercise on a treadmill, which naturally increases the blood flow to your heart, while others who are unable to walk on a treadmill due to physical limitations have a pharmacologic/chemical stress agent called Lexiscan. This medicine will mimic walking on a treadmill by temporarily increasing your coronary blood flow.     REPORT TO [facility] MEDICAL MALL ENTRANCE  **Proceed to the 1st desk on the right, REGISTRATION, to check in**  Please note: this test may take anywhere between 2-4 hours to complete    Instructions regarding medication:   How to prepare for your Myoview test:  Do not eat or drink for 6 hours prior to the test No caffeine for 24 hours prior to the test No smoking 24 hours prior to the test. Ladies, please do not wear dresses.  Skirts or pants are appropriate. Please wear a short sleeve  shirt. No perfume, cologne or lotion. Wear comfortable walking shoes. No heels!   PLEASE NOTIFY THE OFFICE AT LEAST 24 HOURS IN ADVANCE IF YOU ARE UNABLE TO KEEP YOUR APPOINTMENT.  [phone] AND  PLEASE NOTIFY NUCLEAR MEDICINE AT [facility] AT LEAST 24 HOURS IN ADVANCE IF YOU ARE UNABLE TO KEEP YOUR APPOINTMENT. [phone]        Follow-Up: At [facility], you and your health needs are our priority.  As part of our continuing mission to provide you with exceptional heart care, we have created designated Provider Care Teams.  These Care Teams include your primary Cardiologist (physician) and Advanced Practice Providers (APPs -  Physician Assistants and Nurse Practitioners) who all work together to provide you with the care you need, when you need it.  We recommend signing up for the patient portal called "MyChart".  Sign up information is provided on this After Visit Summary.  MyChart is used to connect with patients for Virtual Visits (Telemedicine).  Patients are able to view lab/test results, encounter notes, upcoming appointments, etc.  Non-urgent messages can be sent to your provider as well.   To learn more about what you can do with MyChart, go to [email or web address].    Your next appointment:   1 month(s)  Provider:   You may see Yvonne Kendall, MD or one of the following Advanced Practice Providers on your designated Care Team:   Nicolasa Ducking, NP Eula Listen, PA-C Cadence Fransico Michael, PA-C Charlsie Quest, NP  Carlos Levering, NP
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
## 2023-06-28 ENCOUNTER — Encounter: Payer: Self-pay | Admitting: Internal Medicine

## 2023-06-28 DIAGNOSIS — I7090 Unspecified atherosclerosis: Secondary | ICD-10-CM | POA: Insufficient documentation

## 2023-06-28 DIAGNOSIS — R079 Chest pain, unspecified: Secondary | ICD-10-CM | POA: Insufficient documentation

## 2023-07-24 ENCOUNTER — Other Ambulatory Visit

## 2023-07-30 ENCOUNTER — Ambulatory Visit: Admitting: Student

## 2023-08-30 ENCOUNTER — Other Ambulatory Visit: Payer: Self-pay | Admitting: Internal Medicine

## 2023-08-30 DIAGNOSIS — G8929 Other chronic pain: Secondary | ICD-10-CM

## 2023-08-30 DIAGNOSIS — I10 Essential (primary) hypertension: Secondary | ICD-10-CM

## 2023-08-30 DIAGNOSIS — R7309 Other abnormal glucose: Secondary | ICD-10-CM

## 2023-09-14 ENCOUNTER — Other Ambulatory Visit: Payer: Self-pay | Admitting: Internal Medicine

## 2023-09-14 DIAGNOSIS — I10 Essential (primary) hypertension: Secondary | ICD-10-CM

## 2023-09-14 DIAGNOSIS — M549 Dorsalgia, unspecified: Secondary | ICD-10-CM
